# Patient Record
Sex: Female | Born: 1975 | Race: Black or African American | Hispanic: No | Smoking: Never smoker
Health system: Southern US, Community
[De-identification: ages and names within clinical notes are randomized; demographics above are authoritative.]

## PROBLEM LIST (undated history)

## (undated) ENCOUNTER — Inpatient Hospital Stay (HOSPITAL_COMMUNITY): Payer: Self-pay

## (undated) DIAGNOSIS — A599 Trichomoniasis, unspecified: Secondary | ICD-10-CM

## (undated) DIAGNOSIS — F410 Panic disorder [episodic paroxysmal anxiety] without agoraphobia: Secondary | ICD-10-CM

## (undated) DIAGNOSIS — I10 Essential (primary) hypertension: Secondary | ICD-10-CM

## (undated) DIAGNOSIS — O020 Blighted ovum and nonhydatidiform mole: Secondary | ICD-10-CM

## (undated) HISTORY — PX: DILATION AND CURETTAGE OF UTERUS: SHX78

---

## 1998-05-05 ENCOUNTER — Emergency Department (HOSPITAL_COMMUNITY): Admission: EM | Admit: 1998-05-05 | Discharge: 1998-05-05 | Payer: Self-pay | Admitting: Emergency Medicine

## 1998-06-27 ENCOUNTER — Emergency Department (HOSPITAL_COMMUNITY): Admission: EM | Admit: 1998-06-27 | Discharge: 1998-06-27 | Payer: Self-pay | Admitting: Emergency Medicine

## 1998-08-21 ENCOUNTER — Emergency Department (HOSPITAL_COMMUNITY): Admission: EM | Admit: 1998-08-21 | Discharge: 1998-08-21 | Payer: Self-pay | Admitting: Emergency Medicine

## 1998-09-28 ENCOUNTER — Emergency Department (HOSPITAL_COMMUNITY): Admission: EM | Admit: 1998-09-28 | Discharge: 1998-09-28 | Payer: Self-pay | Admitting: Emergency Medicine

## 1998-09-28 ENCOUNTER — Encounter: Payer: Self-pay | Admitting: Emergency Medicine

## 1999-02-18 ENCOUNTER — Emergency Department (HOSPITAL_COMMUNITY): Admission: EM | Admit: 1999-02-18 | Discharge: 1999-02-18 | Payer: Self-pay | Admitting: Emergency Medicine

## 1999-02-25 ENCOUNTER — Encounter: Payer: Self-pay | Admitting: Emergency Medicine

## 1999-02-25 ENCOUNTER — Emergency Department (HOSPITAL_COMMUNITY): Admission: EM | Admit: 1999-02-25 | Discharge: 1999-02-25 | Payer: Self-pay | Admitting: Emergency Medicine

## 1999-05-08 ENCOUNTER — Emergency Department (HOSPITAL_COMMUNITY): Admission: EM | Admit: 1999-05-08 | Discharge: 1999-05-08 | Payer: Self-pay | Admitting: Emergency Medicine

## 1999-09-06 ENCOUNTER — Inpatient Hospital Stay (HOSPITAL_COMMUNITY): Admission: AD | Admit: 1999-09-06 | Discharge: 1999-09-06 | Payer: Self-pay | Admitting: *Deleted

## 1999-09-06 ENCOUNTER — Inpatient Hospital Stay (HOSPITAL_COMMUNITY): Admission: AD | Admit: 1999-09-06 | Discharge: 1999-09-06 | Payer: Self-pay | Admitting: Obstetrics & Gynecology

## 1999-09-09 ENCOUNTER — Encounter (INDEPENDENT_AMBULATORY_CARE_PROVIDER_SITE_OTHER): Payer: Self-pay

## 1999-09-09 ENCOUNTER — Inpatient Hospital Stay (HOSPITAL_COMMUNITY): Admission: AD | Admit: 1999-09-09 | Discharge: 1999-09-11 | Payer: Self-pay | Admitting: Obstetrics & Gynecology

## 1999-09-25 ENCOUNTER — Encounter: Admission: RE | Admit: 1999-09-25 | Discharge: 1999-09-25 | Payer: Self-pay | Admitting: Obstetrics & Gynecology

## 1999-09-25 ENCOUNTER — Other Ambulatory Visit: Admission: RE | Admit: 1999-09-25 | Discharge: 1999-09-25 | Payer: Self-pay | Admitting: Obstetrics & Gynecology

## 1999-10-13 ENCOUNTER — Emergency Department (HOSPITAL_COMMUNITY): Admission: EM | Admit: 1999-10-13 | Discharge: 1999-10-13 | Payer: Self-pay | Admitting: *Deleted

## 1999-10-16 ENCOUNTER — Emergency Department (HOSPITAL_COMMUNITY): Admission: EM | Admit: 1999-10-16 | Discharge: 1999-10-16 | Payer: Self-pay | Admitting: Emergency Medicine

## 1999-10-20 ENCOUNTER — Emergency Department (HOSPITAL_COMMUNITY): Admission: EM | Admit: 1999-10-20 | Discharge: 1999-10-20 | Payer: Self-pay | Admitting: Emergency Medicine

## 1999-10-30 ENCOUNTER — Emergency Department (HOSPITAL_COMMUNITY): Admission: EM | Admit: 1999-10-30 | Discharge: 1999-10-30 | Payer: Self-pay | Admitting: Emergency Medicine

## 1999-11-03 ENCOUNTER — Encounter: Payer: Self-pay | Admitting: Emergency Medicine

## 1999-11-03 ENCOUNTER — Emergency Department (HOSPITAL_COMMUNITY): Admission: EM | Admit: 1999-11-03 | Discharge: 1999-11-03 | Payer: Self-pay | Admitting: Emergency Medicine

## 1999-11-10 ENCOUNTER — Encounter: Admission: RE | Admit: 1999-11-10 | Discharge: 1999-11-10 | Payer: Self-pay | Admitting: Obstetrics & Gynecology

## 1999-11-13 ENCOUNTER — Encounter: Admission: RE | Admit: 1999-11-13 | Discharge: 1999-11-13 | Payer: Self-pay | Admitting: Hematology and Oncology

## 1999-11-17 ENCOUNTER — Emergency Department (HOSPITAL_COMMUNITY): Admission: EM | Admit: 1999-11-17 | Discharge: 1999-11-17 | Payer: Self-pay | Admitting: Emergency Medicine

## 1999-11-27 ENCOUNTER — Encounter: Admission: RE | Admit: 1999-11-27 | Discharge: 1999-11-27 | Payer: Self-pay | Admitting: Obstetrics & Gynecology

## 1999-12-01 ENCOUNTER — Encounter: Admission: RE | Admit: 1999-12-01 | Discharge: 1999-12-01 | Payer: Self-pay | Admitting: Hematology and Oncology

## 1999-12-03 ENCOUNTER — Emergency Department (HOSPITAL_COMMUNITY): Admission: EM | Admit: 1999-12-03 | Discharge: 1999-12-03 | Payer: Self-pay | Admitting: Emergency Medicine

## 1999-12-09 ENCOUNTER — Encounter: Admission: RE | Admit: 1999-12-09 | Discharge: 1999-12-09 | Payer: Self-pay | Admitting: Internal Medicine

## 1999-12-18 ENCOUNTER — Emergency Department (HOSPITAL_COMMUNITY): Admission: EM | Admit: 1999-12-18 | Discharge: 1999-12-18 | Payer: Self-pay | Admitting: Emergency Medicine

## 1999-12-18 ENCOUNTER — Encounter: Payer: Self-pay | Admitting: Emergency Medicine

## 1999-12-19 ENCOUNTER — Emergency Department (HOSPITAL_COMMUNITY): Admission: EM | Admit: 1999-12-19 | Discharge: 1999-12-19 | Payer: Self-pay | Admitting: Emergency Medicine

## 2000-01-21 ENCOUNTER — Emergency Department (HOSPITAL_COMMUNITY): Admission: EM | Admit: 2000-01-21 | Discharge: 2000-01-21 | Payer: Self-pay | Admitting: Emergency Medicine

## 2000-02-03 ENCOUNTER — Emergency Department (HOSPITAL_COMMUNITY): Admission: EM | Admit: 2000-02-03 | Discharge: 2000-02-03 | Payer: Self-pay | Admitting: Emergency Medicine

## 2000-03-31 ENCOUNTER — Encounter: Admission: RE | Admit: 2000-03-31 | Discharge: 2000-03-31 | Payer: Self-pay | Admitting: Hematology and Oncology

## 2000-04-04 ENCOUNTER — Encounter: Admission: RE | Admit: 2000-04-04 | Discharge: 2000-04-04 | Payer: Self-pay | Admitting: Internal Medicine

## 2000-04-11 ENCOUNTER — Encounter: Admission: RE | Admit: 2000-04-11 | Discharge: 2000-04-11 | Payer: Self-pay | Admitting: Internal Medicine

## 2000-04-15 ENCOUNTER — Inpatient Hospital Stay (HOSPITAL_COMMUNITY): Admission: EM | Admit: 2000-04-15 | Discharge: 2000-04-15 | Payer: Self-pay | Admitting: *Deleted

## 2000-04-25 ENCOUNTER — Encounter: Admission: RE | Admit: 2000-04-25 | Discharge: 2000-04-25 | Payer: Self-pay | Admitting: Internal Medicine

## 2000-05-02 ENCOUNTER — Encounter: Admission: RE | Admit: 2000-05-02 | Discharge: 2000-05-02 | Payer: Self-pay | Admitting: Internal Medicine

## 2000-05-06 ENCOUNTER — Encounter: Admission: RE | Admit: 2000-05-06 | Discharge: 2000-05-06 | Payer: Self-pay | Admitting: Internal Medicine

## 2000-05-06 ENCOUNTER — Ambulatory Visit (HOSPITAL_COMMUNITY): Admission: RE | Admit: 2000-05-06 | Discharge: 2000-05-06 | Payer: Self-pay | Admitting: Internal Medicine

## 2000-05-18 ENCOUNTER — Emergency Department (HOSPITAL_COMMUNITY): Admission: EM | Admit: 2000-05-18 | Discharge: 2000-05-18 | Payer: Self-pay | Admitting: Emergency Medicine

## 2000-05-23 ENCOUNTER — Encounter: Admission: RE | Admit: 2000-05-23 | Discharge: 2000-05-23 | Payer: Self-pay | Admitting: Internal Medicine

## 2000-06-22 ENCOUNTER — Inpatient Hospital Stay (HOSPITAL_COMMUNITY): Admission: AD | Admit: 2000-06-22 | Discharge: 2000-06-22 | Payer: Self-pay | Admitting: Obstetrics

## 2000-06-23 ENCOUNTER — Encounter: Payer: Self-pay | Admitting: *Deleted

## 2000-06-23 ENCOUNTER — Inpatient Hospital Stay (HOSPITAL_COMMUNITY): Admission: AD | Admit: 2000-06-23 | Discharge: 2000-06-23 | Payer: Self-pay | Admitting: Obstetrics & Gynecology

## 2000-06-30 ENCOUNTER — Ambulatory Visit (HOSPITAL_COMMUNITY): Admission: RE | Admit: 2000-06-30 | Discharge: 2000-06-30 | Payer: Self-pay | Admitting: *Deleted

## 2000-07-18 ENCOUNTER — Encounter: Admission: RE | Admit: 2000-07-18 | Discharge: 2000-07-18 | Payer: Self-pay | Admitting: Internal Medicine

## 2000-09-17 ENCOUNTER — Inpatient Hospital Stay (HOSPITAL_COMMUNITY): Admission: AD | Admit: 2000-09-17 | Discharge: 2000-09-17 | Payer: Self-pay | Admitting: *Deleted

## 2000-10-21 ENCOUNTER — Inpatient Hospital Stay (HOSPITAL_COMMUNITY): Admission: AD | Admit: 2000-10-21 | Discharge: 2000-10-21 | Payer: Self-pay | Admitting: *Deleted

## 2000-11-04 ENCOUNTER — Ambulatory Visit (HOSPITAL_COMMUNITY): Admission: RE | Admit: 2000-11-04 | Discharge: 2000-11-04 | Payer: Self-pay | Admitting: *Deleted

## 2001-01-01 ENCOUNTER — Emergency Department (HOSPITAL_COMMUNITY): Admission: EM | Admit: 2001-01-01 | Discharge: 2001-01-01 | Payer: Self-pay | Admitting: Emergency Medicine

## 2001-01-26 ENCOUNTER — Inpatient Hospital Stay (HOSPITAL_COMMUNITY): Admission: AD | Admit: 2001-01-26 | Discharge: 2001-01-26 | Payer: Self-pay | Admitting: *Deleted

## 2001-02-18 ENCOUNTER — Inpatient Hospital Stay (HOSPITAL_COMMUNITY): Admission: AD | Admit: 2001-02-18 | Discharge: 2001-02-19 | Payer: Self-pay | Admitting: Obstetrics

## 2001-03-31 ENCOUNTER — Other Ambulatory Visit: Admission: RE | Admit: 2001-03-31 | Discharge: 2001-03-31 | Payer: Self-pay | Admitting: Obstetrics & Gynecology

## 2001-03-31 ENCOUNTER — Encounter: Admission: RE | Admit: 2001-03-31 | Discharge: 2001-03-31 | Payer: Self-pay | Admitting: Obstetrics & Gynecology

## 2001-06-26 ENCOUNTER — Emergency Department (HOSPITAL_COMMUNITY): Admission: EM | Admit: 2001-06-26 | Discharge: 2001-06-26 | Payer: Self-pay | Admitting: Emergency Medicine

## 2001-07-28 ENCOUNTER — Encounter: Admission: RE | Admit: 2001-07-28 | Discharge: 2001-07-28 | Payer: Self-pay | Admitting: Internal Medicine

## 2001-08-28 ENCOUNTER — Emergency Department (HOSPITAL_COMMUNITY): Admission: EM | Admit: 2001-08-28 | Discharge: 2001-08-28 | Payer: Self-pay | Admitting: Emergency Medicine

## 2002-01-09 ENCOUNTER — Emergency Department (HOSPITAL_COMMUNITY): Admission: EM | Admit: 2002-01-09 | Discharge: 2002-01-09 | Payer: Self-pay | Admitting: Emergency Medicine

## 2002-04-27 ENCOUNTER — Emergency Department (HOSPITAL_COMMUNITY): Admission: EM | Admit: 2002-04-27 | Discharge: 2002-04-27 | Payer: Self-pay | Admitting: Emergency Medicine

## 2002-05-24 ENCOUNTER — Emergency Department (HOSPITAL_COMMUNITY): Admission: EM | Admit: 2002-05-24 | Discharge: 2002-05-24 | Payer: Self-pay | Admitting: Emergency Medicine

## 2002-08-21 ENCOUNTER — Emergency Department (HOSPITAL_COMMUNITY): Admission: EM | Admit: 2002-08-21 | Discharge: 2002-08-21 | Payer: Self-pay | Admitting: Emergency Medicine

## 2003-04-04 ENCOUNTER — Emergency Department (HOSPITAL_COMMUNITY): Admission: EM | Admit: 2003-04-04 | Discharge: 2003-04-04 | Payer: Self-pay | Admitting: Emergency Medicine

## 2003-12-10 ENCOUNTER — Emergency Department (HOSPITAL_COMMUNITY): Admission: EM | Admit: 2003-12-10 | Discharge: 2003-12-10 | Payer: Self-pay | Admitting: Emergency Medicine

## 2003-12-25 ENCOUNTER — Encounter: Payer: Self-pay | Admitting: *Deleted

## 2003-12-25 ENCOUNTER — Encounter (INDEPENDENT_AMBULATORY_CARE_PROVIDER_SITE_OTHER): Payer: Self-pay | Admitting: *Deleted

## 2003-12-25 ENCOUNTER — Inpatient Hospital Stay (HOSPITAL_COMMUNITY): Admission: AD | Admit: 2003-12-25 | Discharge: 2003-12-26 | Payer: Self-pay | Admitting: *Deleted

## 2004-02-06 ENCOUNTER — Emergency Department (HOSPITAL_COMMUNITY): Admission: EM | Admit: 2004-02-06 | Discharge: 2004-02-06 | Payer: Self-pay | Admitting: Emergency Medicine

## 2004-07-06 ENCOUNTER — Emergency Department (HOSPITAL_COMMUNITY): Admission: EM | Admit: 2004-07-06 | Discharge: 2004-07-06 | Payer: Self-pay | Admitting: Emergency Medicine

## 2004-10-30 ENCOUNTER — Emergency Department (HOSPITAL_COMMUNITY): Admission: EM | Admit: 2004-10-30 | Discharge: 2004-10-30 | Payer: Self-pay | Admitting: *Deleted

## 2005-01-05 ENCOUNTER — Encounter: Admission: RE | Admit: 2005-01-05 | Discharge: 2005-01-05 | Payer: Self-pay | Admitting: *Deleted

## 2005-07-05 ENCOUNTER — Ambulatory Visit (HOSPITAL_COMMUNITY): Admission: RE | Admit: 2005-07-05 | Discharge: 2005-07-05 | Payer: Self-pay | Admitting: Family Medicine

## 2005-09-25 ENCOUNTER — Emergency Department (HOSPITAL_COMMUNITY): Admission: EM | Admit: 2005-09-25 | Discharge: 2005-09-25 | Payer: Self-pay | Admitting: Emergency Medicine

## 2005-11-05 ENCOUNTER — Emergency Department (HOSPITAL_COMMUNITY): Admission: EM | Admit: 2005-11-05 | Discharge: 2005-11-05 | Payer: Self-pay | Admitting: Emergency Medicine

## 2005-12-07 ENCOUNTER — Emergency Department (HOSPITAL_COMMUNITY): Admission: EM | Admit: 2005-12-07 | Discharge: 2005-12-07 | Payer: Self-pay | Admitting: Emergency Medicine

## 2006-04-21 ENCOUNTER — Emergency Department (HOSPITAL_COMMUNITY): Admission: EM | Admit: 2006-04-21 | Discharge: 2006-04-21 | Payer: Self-pay | Admitting: Emergency Medicine

## 2006-04-25 ENCOUNTER — Inpatient Hospital Stay (HOSPITAL_COMMUNITY): Admission: AD | Admit: 2006-04-25 | Discharge: 2006-04-25 | Payer: Self-pay | Admitting: Gynecology

## 2006-04-25 ENCOUNTER — Ambulatory Visit: Payer: Self-pay | Admitting: Obstetrics & Gynecology

## 2006-04-26 ENCOUNTER — Encounter (INDEPENDENT_AMBULATORY_CARE_PROVIDER_SITE_OTHER): Payer: Self-pay | Admitting: *Deleted

## 2006-04-26 ENCOUNTER — Ambulatory Visit (HOSPITAL_COMMUNITY): Admission: RE | Admit: 2006-04-26 | Discharge: 2006-04-26 | Payer: Self-pay | Admitting: Obstetrics & Gynecology

## 2006-04-26 ENCOUNTER — Ambulatory Visit: Payer: Self-pay | Admitting: Obstetrics & Gynecology

## 2006-05-04 ENCOUNTER — Ambulatory Visit: Payer: Self-pay | Admitting: Obstetrics & Gynecology

## 2006-05-11 ENCOUNTER — Ambulatory Visit: Payer: Self-pay | Admitting: Obstetrics and Gynecology

## 2006-05-18 ENCOUNTER — Ambulatory Visit: Payer: Self-pay | Admitting: Gynecology

## 2006-05-25 ENCOUNTER — Ambulatory Visit: Payer: Self-pay | Admitting: Gynecology

## 2006-05-31 ENCOUNTER — Ambulatory Visit: Admission: RE | Admit: 2006-05-31 | Discharge: 2006-05-31 | Payer: Self-pay | Admitting: Gynecologic Oncology

## 2006-06-09 ENCOUNTER — Ambulatory Visit (HOSPITAL_COMMUNITY): Admission: RE | Admit: 2006-06-09 | Discharge: 2006-06-09 | Payer: Self-pay | Admitting: Gynecologic Oncology

## 2006-06-16 ENCOUNTER — Ambulatory Visit: Payer: Self-pay | Admitting: Obstetrics and Gynecology

## 2006-06-24 ENCOUNTER — Ambulatory Visit: Payer: Self-pay | Admitting: Obstetrics & Gynecology

## 2006-07-06 ENCOUNTER — Ambulatory Visit: Payer: Self-pay | Admitting: Gynecology

## 2006-08-10 ENCOUNTER — Ambulatory Visit: Payer: Self-pay | Admitting: Gynecology

## 2006-09-17 ENCOUNTER — Emergency Department (HOSPITAL_COMMUNITY): Admission: EM | Admit: 2006-09-17 | Discharge: 2006-09-17 | Payer: Self-pay | Admitting: Emergency Medicine

## 2006-10-12 ENCOUNTER — Ambulatory Visit: Payer: Self-pay | Admitting: Obstetrics & Gynecology

## 2006-10-29 ENCOUNTER — Emergency Department (HOSPITAL_COMMUNITY): Admission: EM | Admit: 2006-10-29 | Discharge: 2006-10-29 | Payer: Self-pay | Admitting: Emergency Medicine

## 2007-03-03 ENCOUNTER — Inpatient Hospital Stay (HOSPITAL_COMMUNITY): Admission: AD | Admit: 2007-03-03 | Discharge: 2007-03-03 | Payer: Self-pay | Admitting: Gynecology

## 2007-03-04 ENCOUNTER — Inpatient Hospital Stay (HOSPITAL_COMMUNITY): Admission: AD | Admit: 2007-03-04 | Discharge: 2007-03-04 | Payer: Self-pay | Admitting: Obstetrics & Gynecology

## 2007-03-25 ENCOUNTER — Inpatient Hospital Stay (HOSPITAL_COMMUNITY): Admission: AD | Admit: 2007-03-25 | Discharge: 2007-03-25 | Payer: Self-pay | Admitting: Obstetrics and Gynecology

## 2007-05-18 ENCOUNTER — Ambulatory Visit (HOSPITAL_COMMUNITY): Admission: RE | Admit: 2007-05-18 | Discharge: 2007-05-18 | Payer: Self-pay | Admitting: Obstetrics & Gynecology

## 2007-05-29 ENCOUNTER — Ambulatory Visit: Payer: Self-pay | Admitting: Obstetrics & Gynecology

## 2007-05-31 ENCOUNTER — Ambulatory Visit (HOSPITAL_COMMUNITY): Admission: RE | Admit: 2007-05-31 | Discharge: 2007-05-31 | Payer: Self-pay | Admitting: Obstetrics & Gynecology

## 2007-05-31 ENCOUNTER — Ambulatory Visit: Payer: Self-pay | Admitting: Gynecology

## 2007-05-31 ENCOUNTER — Encounter: Payer: Self-pay | Admitting: *Deleted

## 2007-06-02 ENCOUNTER — Ambulatory Visit (HOSPITAL_COMMUNITY): Admission: RE | Admit: 2007-06-02 | Discharge: 2007-06-02 | Payer: Self-pay | Admitting: Obstetrics & Gynecology

## 2007-06-12 ENCOUNTER — Ambulatory Visit: Payer: Self-pay | Admitting: Obstetrics & Gynecology

## 2007-07-05 ENCOUNTER — Ambulatory Visit (HOSPITAL_COMMUNITY): Admission: RE | Admit: 2007-07-05 | Discharge: 2007-07-05 | Payer: Self-pay | Admitting: Obstetrics & Gynecology

## 2007-07-10 ENCOUNTER — Ambulatory Visit: Payer: Self-pay | Admitting: Obstetrics & Gynecology

## 2007-07-17 ENCOUNTER — Ambulatory Visit: Payer: Self-pay | Admitting: Obstetrics & Gynecology

## 2007-07-26 ENCOUNTER — Ambulatory Visit (HOSPITAL_COMMUNITY): Admission: RE | Admit: 2007-07-26 | Discharge: 2007-07-26 | Payer: Self-pay | Admitting: Obstetrics & Gynecology

## 2007-07-31 ENCOUNTER — Ambulatory Visit: Payer: Self-pay | Admitting: Obstetrics & Gynecology

## 2007-08-07 ENCOUNTER — Ambulatory Visit: Payer: Self-pay | Admitting: Obstetrics & Gynecology

## 2007-08-16 ENCOUNTER — Ambulatory Visit (HOSPITAL_COMMUNITY): Admission: RE | Admit: 2007-08-16 | Discharge: 2007-08-16 | Payer: Self-pay | Admitting: Obstetrics & Gynecology

## 2007-08-17 ENCOUNTER — Ambulatory Visit: Payer: Self-pay | Admitting: Obstetrics & Gynecology

## 2007-08-21 ENCOUNTER — Ambulatory Visit: Payer: Self-pay | Admitting: Obstetrics & Gynecology

## 2007-08-24 ENCOUNTER — Ambulatory Visit: Payer: Self-pay | Admitting: Obstetrics & Gynecology

## 2007-08-28 ENCOUNTER — Ambulatory Visit: Payer: Self-pay | Admitting: Obstetrics & Gynecology

## 2007-08-31 ENCOUNTER — Ambulatory Visit: Payer: Self-pay | Admitting: Obstetrics & Gynecology

## 2007-08-31 ENCOUNTER — Ambulatory Visit: Payer: Self-pay | Admitting: *Deleted

## 2007-08-31 ENCOUNTER — Observation Stay (HOSPITAL_COMMUNITY): Admission: AD | Admit: 2007-08-31 | Discharge: 2007-09-01 | Payer: Self-pay | Admitting: Obstetrics & Gynecology

## 2007-08-31 ENCOUNTER — Encounter: Payer: Self-pay | Admitting: *Deleted

## 2007-09-02 ENCOUNTER — Inpatient Hospital Stay (HOSPITAL_COMMUNITY): Admission: AD | Admit: 2007-09-02 | Discharge: 2007-09-02 | Payer: Self-pay | Admitting: Obstetrics and Gynecology

## 2007-09-02 ENCOUNTER — Ambulatory Visit: Payer: Self-pay | Admitting: Physician Assistant

## 2007-09-04 ENCOUNTER — Ambulatory Visit: Payer: Self-pay | Admitting: *Deleted

## 2007-09-06 ENCOUNTER — Ambulatory Visit: Payer: Self-pay | Admitting: Gynecology

## 2007-09-06 ENCOUNTER — Ambulatory Visit (HOSPITAL_COMMUNITY): Admission: RE | Admit: 2007-09-06 | Discharge: 2007-09-06 | Payer: Self-pay | Admitting: Obstetrics & Gynecology

## 2007-09-11 ENCOUNTER — Ambulatory Visit: Payer: Self-pay | Admitting: *Deleted

## 2007-09-11 ENCOUNTER — Ambulatory Visit (HOSPITAL_COMMUNITY): Admission: RE | Admit: 2007-09-11 | Discharge: 2007-09-11 | Payer: Self-pay | Admitting: Gynecology

## 2007-09-12 ENCOUNTER — Inpatient Hospital Stay (HOSPITAL_COMMUNITY): Admission: AD | Admit: 2007-09-12 | Discharge: 2007-09-15 | Payer: Self-pay | Admitting: Obstetrics and Gynecology

## 2007-09-12 ENCOUNTER — Ambulatory Visit: Payer: Self-pay | Admitting: Advanced Practice Midwife

## 2007-10-11 ENCOUNTER — Ambulatory Visit: Payer: Self-pay | Admitting: Obstetrics and Gynecology

## 2008-03-03 ENCOUNTER — Emergency Department (HOSPITAL_COMMUNITY): Admission: EM | Admit: 2008-03-03 | Discharge: 2008-03-03 | Payer: Self-pay | Admitting: Emergency Medicine

## 2008-07-13 ENCOUNTER — Emergency Department (HOSPITAL_COMMUNITY): Admission: EM | Admit: 2008-07-13 | Discharge: 2008-07-13 | Payer: Self-pay | Admitting: Emergency Medicine

## 2008-07-16 ENCOUNTER — Emergency Department (HOSPITAL_COMMUNITY): Admission: EM | Admit: 2008-07-16 | Discharge: 2008-07-16 | Payer: Self-pay | Admitting: Emergency Medicine

## 2008-11-29 ENCOUNTER — Emergency Department (HOSPITAL_COMMUNITY): Admission: EM | Admit: 2008-11-29 | Discharge: 2008-11-29 | Payer: Self-pay | Admitting: Emergency Medicine

## 2009-02-12 ENCOUNTER — Emergency Department (HOSPITAL_COMMUNITY): Admission: EM | Admit: 2009-02-12 | Discharge: 2009-02-12 | Payer: Self-pay | Admitting: Emergency Medicine

## 2009-06-14 ENCOUNTER — Emergency Department (HOSPITAL_COMMUNITY): Admission: EM | Admit: 2009-06-14 | Discharge: 2009-06-14 | Payer: Self-pay | Admitting: Emergency Medicine

## 2009-07-24 ENCOUNTER — Emergency Department (HOSPITAL_COMMUNITY): Admission: EM | Admit: 2009-07-24 | Discharge: 2009-07-24 | Payer: Self-pay | Admitting: Family Medicine

## 2009-09-27 ENCOUNTER — Emergency Department (HOSPITAL_COMMUNITY): Admission: EM | Admit: 2009-09-27 | Discharge: 2009-09-28 | Payer: Self-pay | Admitting: Emergency Medicine

## 2009-10-28 ENCOUNTER — Inpatient Hospital Stay (HOSPITAL_COMMUNITY): Admission: AD | Admit: 2009-10-28 | Discharge: 2009-10-28 | Payer: Self-pay | Admitting: Obstetrics & Gynecology

## 2010-01-18 ENCOUNTER — Inpatient Hospital Stay (HOSPITAL_COMMUNITY): Admission: AD | Admit: 2010-01-18 | Discharge: 2010-01-18 | Payer: Self-pay | Admitting: Obstetrics & Gynecology

## 2010-01-18 ENCOUNTER — Ambulatory Visit: Payer: Self-pay | Admitting: Obstetrics and Gynecology

## 2010-02-05 IMAGING — CR DG ABDOMEN 2V
2 series · 2 of 2 positions shown · non-contrast
Comparison: None

CLINICAL DATA: Abdominal pain.

ABDOMEN - 2 VIEW

[view not recorded (1 of 2)]
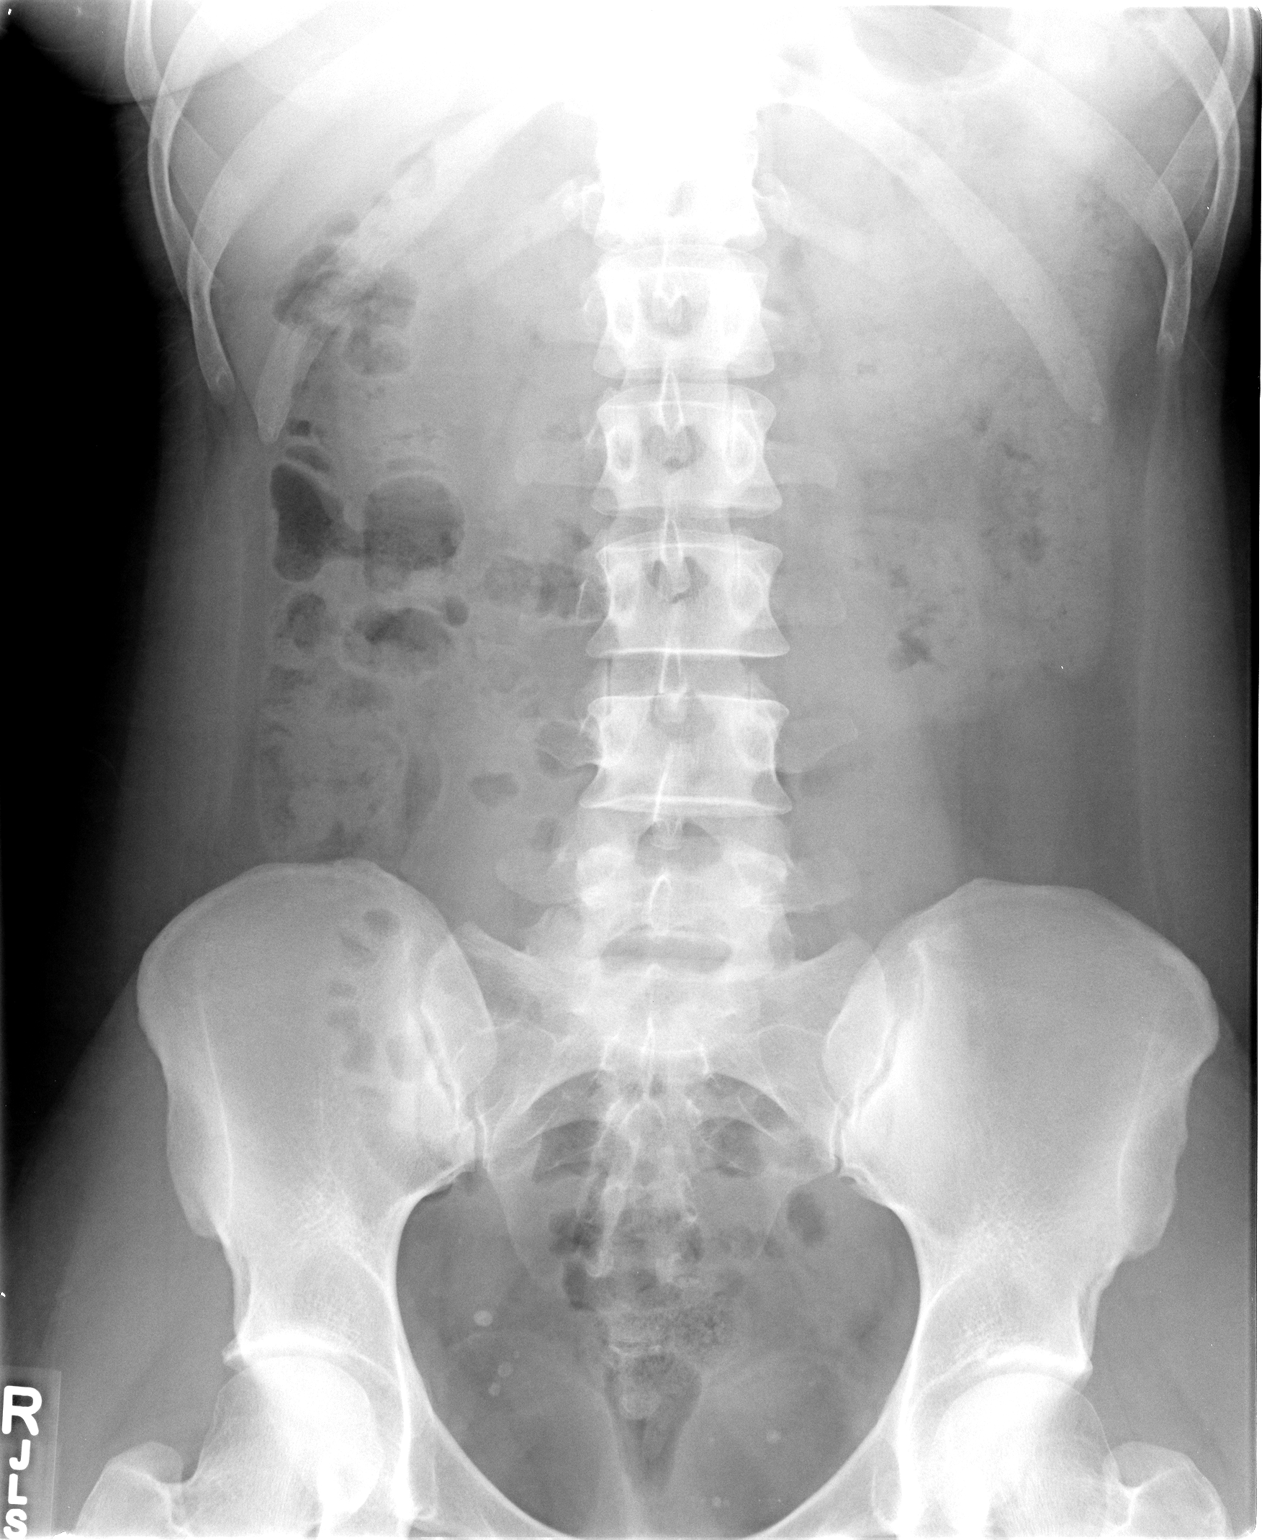

[view not recorded (2 of 2)]
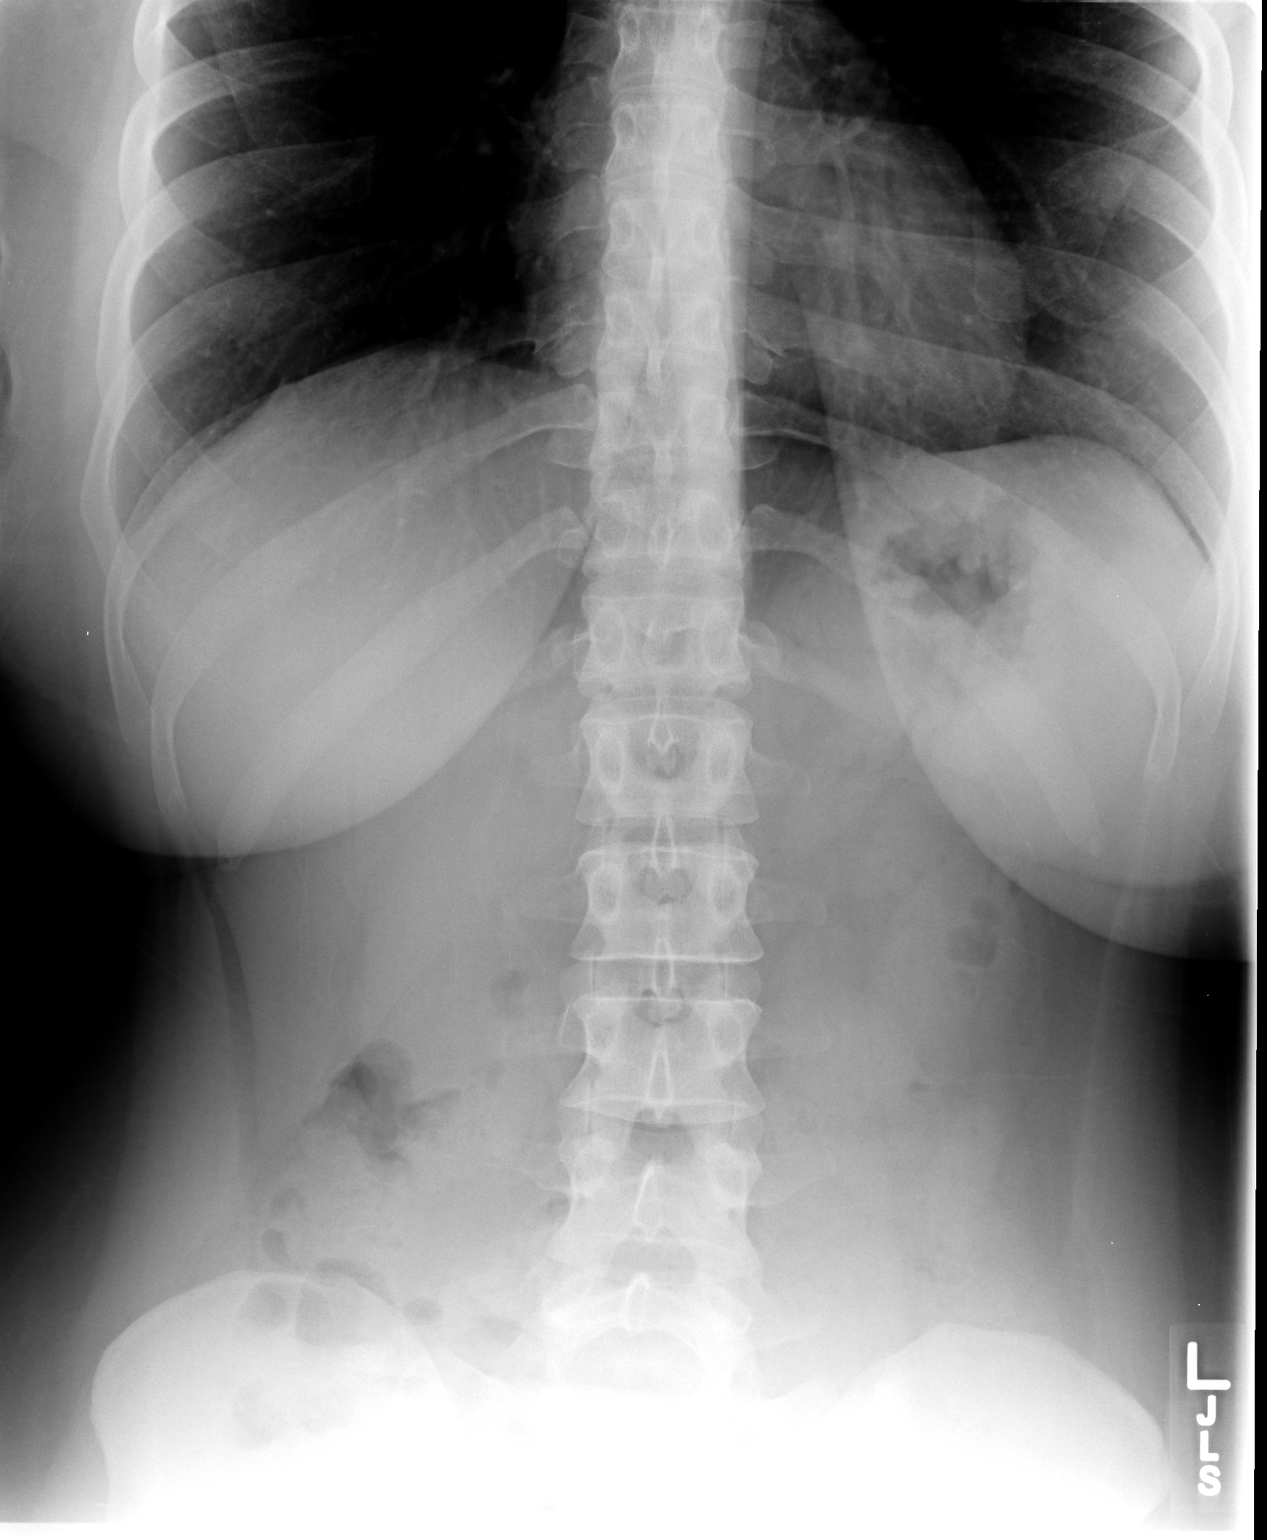

[2 of 2 positions shown; findings below may reference images not displayed]

FINDINGS: Bowel gas pattern is unremarkable.  Properitoneal fat
stripes and psoas muscle margins are defined.  No unusual
calcification.  No definite mass effect.  Bony structures
unremarkable.
IMPRESSION: No acute abdominal findings.

## 2010-03-05 ENCOUNTER — Ambulatory Visit (HOSPITAL_COMMUNITY): Admission: RE | Admit: 2010-03-05 | Discharge: 2010-03-05 | Payer: Self-pay | Admitting: Obstetrics & Gynecology

## 2010-03-26 ENCOUNTER — Encounter (INDEPENDENT_AMBULATORY_CARE_PROVIDER_SITE_OTHER): Payer: Self-pay | Admitting: *Deleted

## 2010-03-26 ENCOUNTER — Ambulatory Visit: Payer: Self-pay | Admitting: Family Medicine

## 2010-03-26 LAB — CONVERTED CEMR LAB
ALT: 11 units/L (ref 0–35)
Albumin: 3.6 g/dL (ref 3.5–5.2)
CO2: 21 meq/L (ref 19–32)
Glucose, Bld: 79 mg/dL (ref 70–99)
Platelets: 209 10*3/uL (ref 150–400)
Potassium: 3.5 meq/L (ref 3.5–5.3)
RBC: 4.17 M/uL (ref 3.87–5.11)
Sodium: 140 meq/L (ref 135–145)
Total Bilirubin: 0.8 mg/dL (ref 0.3–1.2)
Total Protein: 6.7 g/dL (ref 6.0–8.3)
Uric Acid, Serum: 5.5 mg/dL (ref 2.4–7.0)
WBC: 7.2 10*3/uL (ref 4.0–10.5)

## 2010-04-02 ENCOUNTER — Encounter: Payer: Self-pay | Admitting: Obstetrics and Gynecology

## 2010-04-02 ENCOUNTER — Ambulatory Visit: Payer: Self-pay | Admitting: Family Medicine

## 2010-04-02 LAB — CONVERTED CEMR LAB
ALT: 9 units/L (ref 0–35)
AST: 16 units/L (ref 0–37)
CO2: 20 meq/L (ref 19–32)
Creatinine 24 HR UR: 1071 mg/24hr (ref 700–1800)
Creatinine Clearance: 108 mL/min (ref 75–115)
Creatinine, Ser: 0.69 mg/dL (ref 0.40–1.20)
HCT: 32.9 % — ABNORMAL LOW (ref 36.0–46.0)
MCV: 81.8 fL (ref 78.0–100.0)
Platelets: 187 10*3/uL (ref 150–400)
Protein, Ur: 77 mg/24hr (ref 50–100)
RDW: 13.8 % (ref 11.5–15.5)
Sodium: 139 meq/L (ref 135–145)
Total Bilirubin: 0.6 mg/dL (ref 0.3–1.2)
Total Protein: 6.4 g/dL (ref 6.0–8.3)
WBC: 7 10*3/uL (ref 4.0–10.5)

## 2010-04-09 ENCOUNTER — Ambulatory Visit (HOSPITAL_COMMUNITY): Admission: RE | Admit: 2010-04-09 | Discharge: 2010-04-09 | Payer: Self-pay | Admitting: Anesthesiology

## 2010-04-16 ENCOUNTER — Encounter (INDEPENDENT_AMBULATORY_CARE_PROVIDER_SITE_OTHER): Payer: Self-pay | Admitting: *Deleted

## 2010-04-16 ENCOUNTER — Ambulatory Visit: Payer: Self-pay | Admitting: Obstetrics and Gynecology

## 2010-04-16 LAB — CONVERTED CEMR LAB
Chlamydia, DNA Probe: NEGATIVE
GC Probe Amp, Genital: NEGATIVE

## 2010-04-23 ENCOUNTER — Ambulatory Visit: Payer: Self-pay | Admitting: Obstetrics & Gynecology

## 2010-04-30 ENCOUNTER — Ambulatory Visit (HOSPITAL_COMMUNITY): Admission: RE | Admit: 2010-04-30 | Discharge: 2010-04-30 | Payer: Self-pay | Admitting: Family Medicine

## 2010-04-30 ENCOUNTER — Encounter: Payer: Self-pay | Admitting: Physician Assistant

## 2010-04-30 ENCOUNTER — Ambulatory Visit: Payer: Self-pay | Admitting: Obstetrics and Gynecology

## 2010-05-04 ENCOUNTER — Inpatient Hospital Stay (HOSPITAL_COMMUNITY): Admission: AD | Admit: 2010-05-04 | Discharge: 2010-05-06 | Payer: Self-pay | Admitting: Obstetrics & Gynecology

## 2010-05-04 ENCOUNTER — Ambulatory Visit: Payer: Self-pay | Admitting: Obstetrics and Gynecology

## 2010-05-12 ENCOUNTER — Emergency Department (HOSPITAL_COMMUNITY): Admission: EM | Admit: 2010-05-12 | Discharge: 2010-05-12 | Payer: Self-pay | Admitting: Emergency Medicine

## 2010-10-04 ENCOUNTER — Encounter: Payer: Self-pay | Admitting: *Deleted

## 2010-10-05 ENCOUNTER — Encounter: Payer: Self-pay | Admitting: *Deleted

## 2010-11-12 IMAGING — US US OB LIMITED
1 series · 11 of 11 positions shown · non-contrast
Comparison: none

OBSTETRICAL ULTRASOUND:
 This ultrasound exam was performed in the [HOSPITAL] Ultrasound Department.  The OB US report was generated in the AS system, and faxed to the ordering physician.  This report is also available in [HOSPITAL]?s [employer] and in [REDACTED] PACS.

[Series 1: us fetal bpp w/o nonstress · non-contrast · 0.33mm/px · 11 acquisitions, 11 frames shown]
[im 1/11]
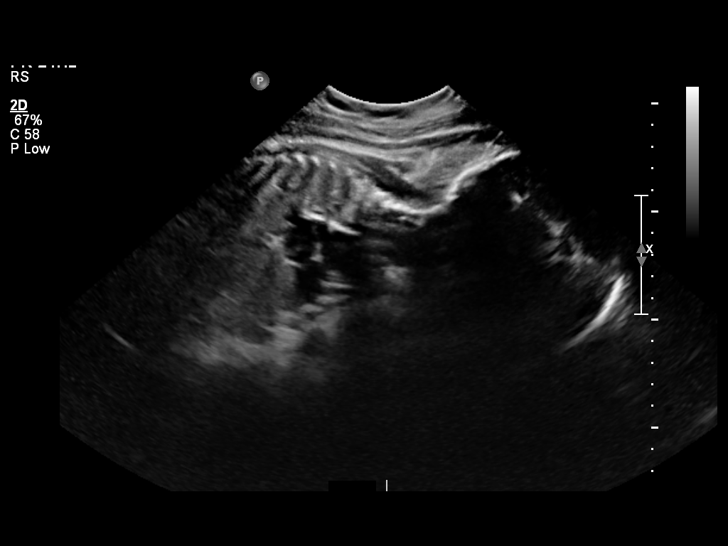
[im 2/11]
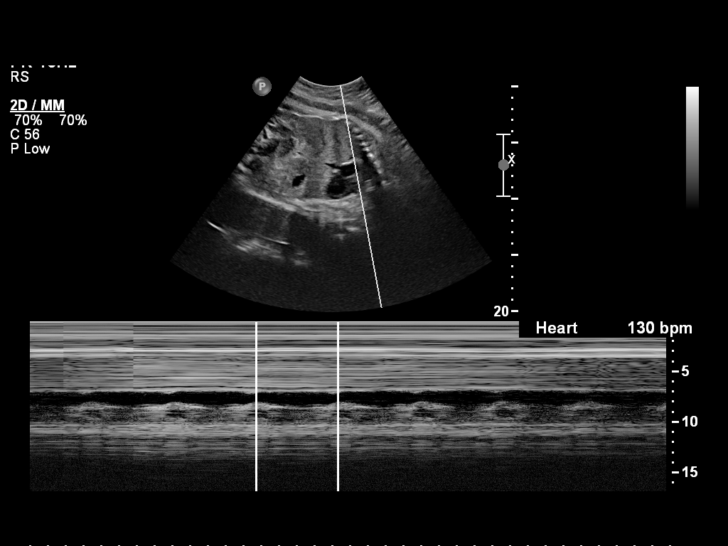
[im 3/11]
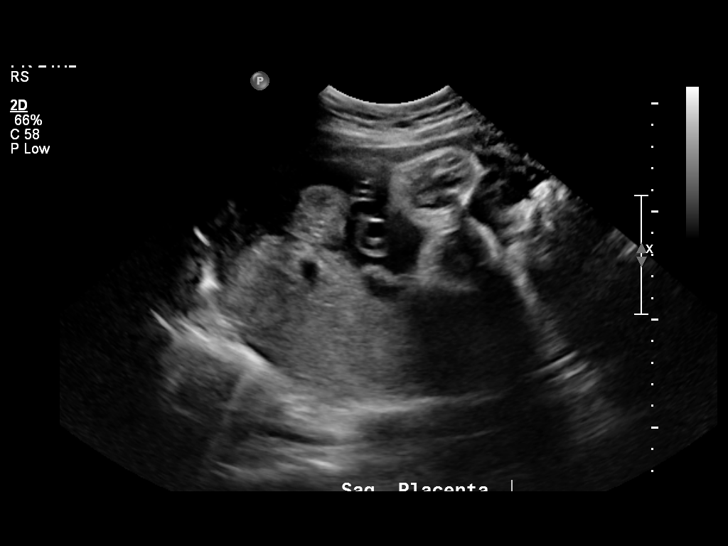
[im 4/11]
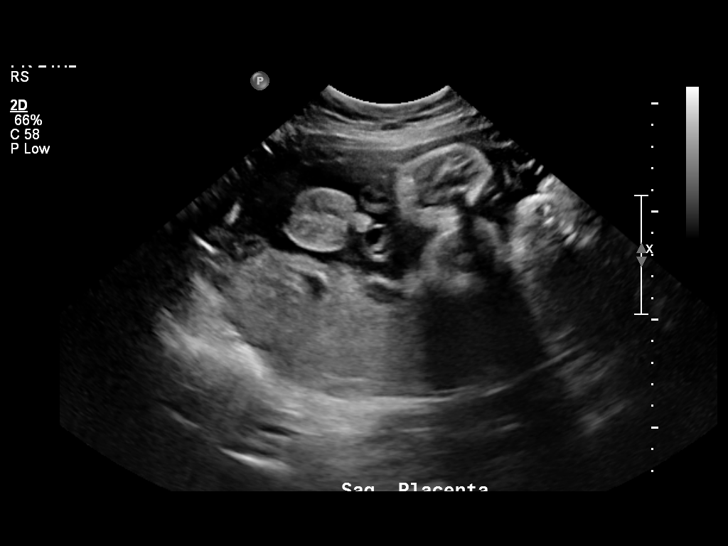
[im 5/11]
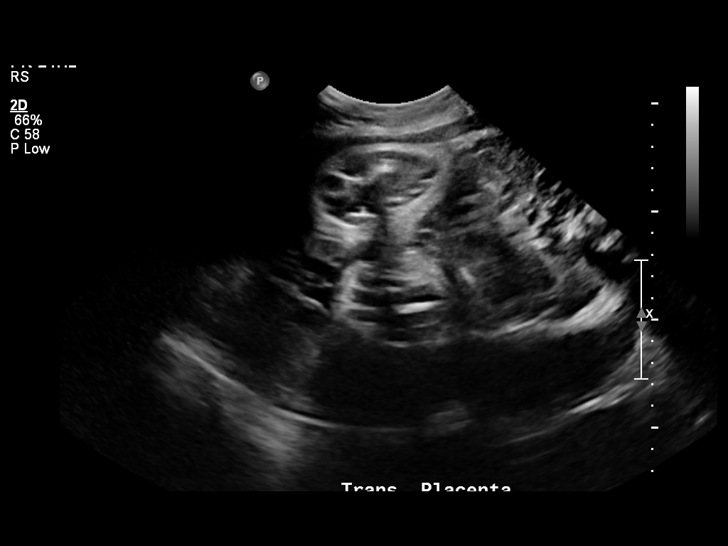
[im 6/11]
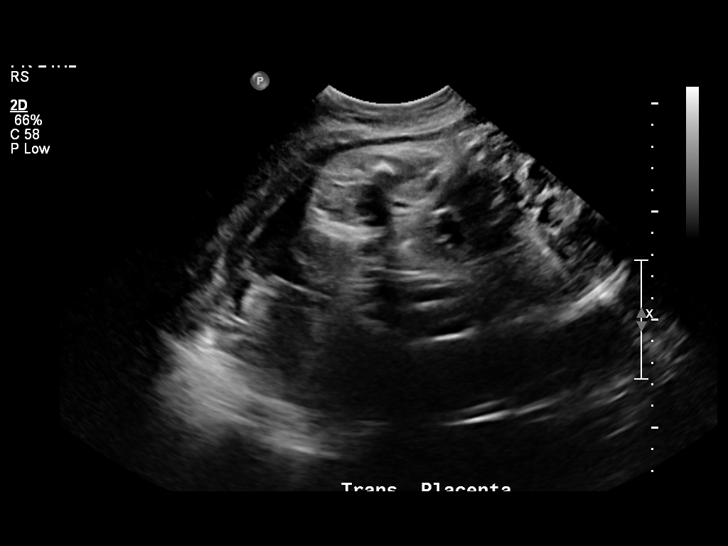
[im 7/11]
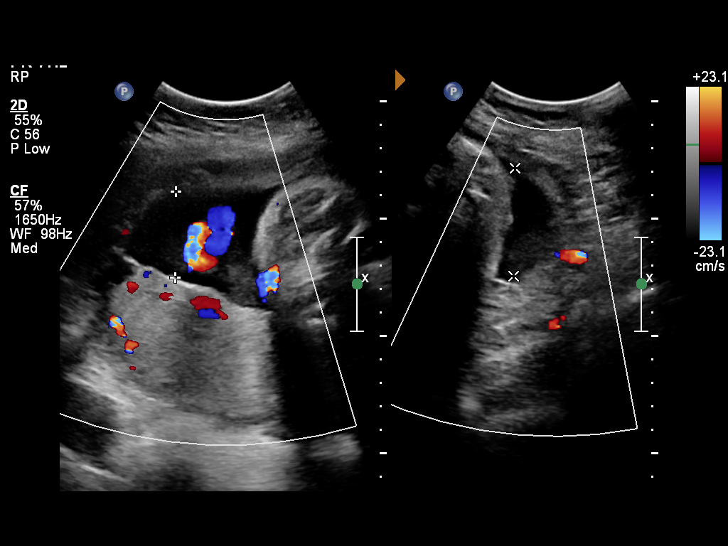
[im 8/11]
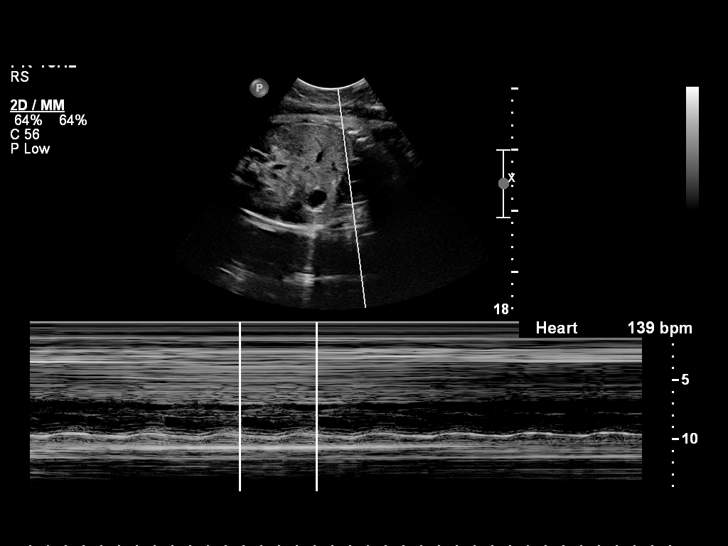
[im 9/11]
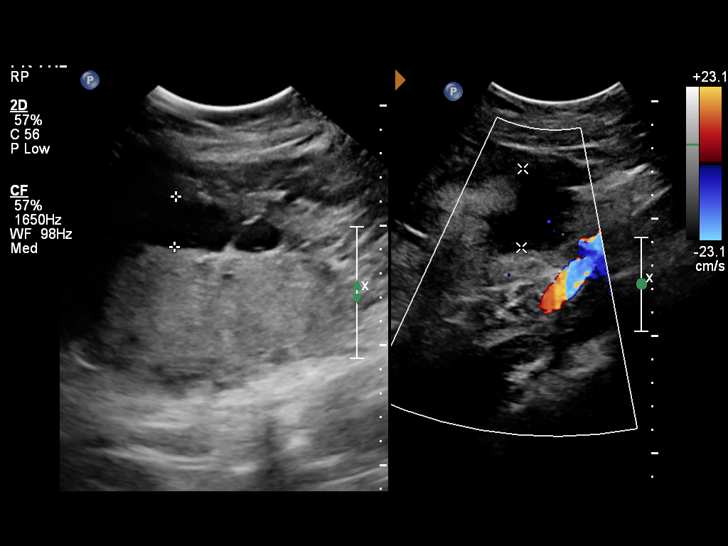
[im 10/11]
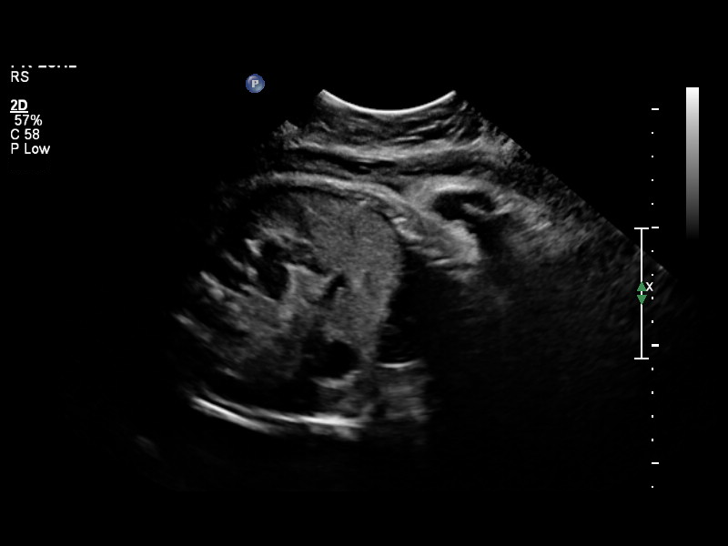
[im 11/11]
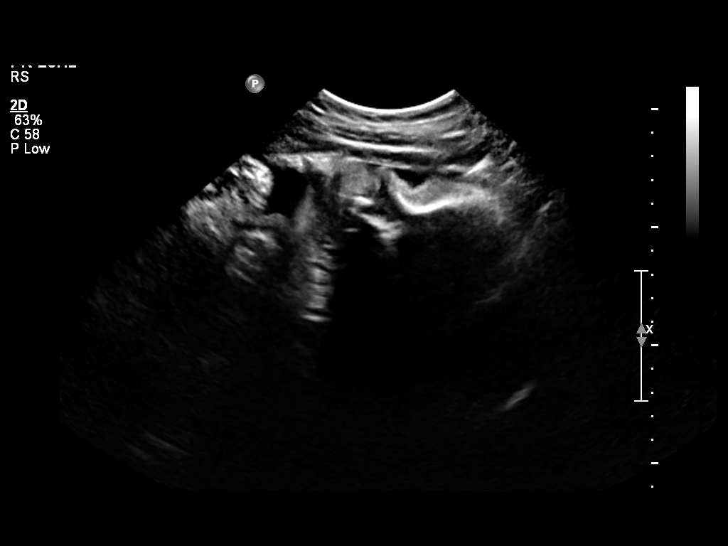

[11 of 11 positions shown; findings below may reference images not displayed]

IMPRESSION: See AS Obstetric US report.

## 2010-11-27 LAB — URINALYSIS, ROUTINE W REFLEX MICROSCOPIC
Glucose, UA: NEGATIVE mg/dL
Ketones, ur: NEGATIVE mg/dL
Nitrite: NEGATIVE
Protein, ur: NEGATIVE mg/dL
Urobilinogen, UA: 0.2 mg/dL (ref 0.0–1.0)

## 2010-11-27 LAB — POCT URINALYSIS DIPSTICK
Bilirubin Urine: NEGATIVE
Glucose, UA: NEGATIVE mg/dL
Hgb urine dipstick: NEGATIVE
Ketones, ur: NEGATIVE mg/dL
Ketones, ur: NEGATIVE mg/dL
Nitrite: NEGATIVE
Protein, ur: 30 mg/dL — AB
Protein, ur: 30 mg/dL — AB
Protein, ur: NEGATIVE mg/dL
Specific Gravity, Urine: 1.02 (ref 1.005–1.030)
Urobilinogen, UA: 2 mg/dL — ABNORMAL HIGH (ref 0.0–1.0)
pH: 6 (ref 5.0–8.0)
pH: 6 (ref 5.0–8.0)

## 2010-11-27 LAB — CBC
HCT: 34.8 % — ABNORMAL LOW (ref 36.0–46.0)
HCT: 35.5 % — ABNORMAL LOW (ref 36.0–46.0)
Hemoglobin: 11.7 g/dL — ABNORMAL LOW (ref 12.0–15.0)
MCH: 29.9 pg (ref 26.0–34.0)
MCHC: 33.6 g/dL (ref 30.0–36.0)
MCV: 88.5 fL (ref 78.0–100.0)
MCV: 88.6 fL (ref 78.0–100.0)
RDW: 13.8 % (ref 11.5–15.5)
WBC: 12.8 10*3/uL — ABNORMAL HIGH (ref 4.0–10.5)

## 2010-11-27 LAB — URINE MICROSCOPIC-ADD ON

## 2010-11-28 LAB — POCT URINALYSIS DIP (DEVICE)
Hgb urine dipstick: NEGATIVE
Protein, ur: 30 mg/dL — AB
Specific Gravity, Urine: 1.02 (ref 1.005–1.030)
Urobilinogen, UA: 1 mg/dL (ref 0.0–1.0)
pH: 6.5 (ref 5.0–8.0)

## 2010-11-29 LAB — WET PREP, GENITAL: Yeast Wet Prep HPF POC: NONE SEEN

## 2010-11-29 LAB — URINALYSIS, ROUTINE W REFLEX MICROSCOPIC
Bilirubin Urine: NEGATIVE
Nitrite: NEGATIVE
Specific Gravity, Urine: 1.006 (ref 1.005–1.030)
pH: 6.5 (ref 5.0–8.0)

## 2010-11-29 LAB — CBC
Hemoglobin: 13.1 g/dL (ref 12.0–15.0)
MCHC: 34.3 g/dL (ref 30.0–36.0)
RDW: 13.6 % (ref 11.5–15.5)

## 2010-11-29 LAB — COMPREHENSIVE METABOLIC PANEL
ALT: 50 U/L — ABNORMAL HIGH (ref 0–35)
Calcium: 8.9 mg/dL (ref 8.4–10.5)
GFR calc Af Amer: >60 mL/min (ref >60)
Glucose, Bld: 105 mg/dL — ABNORMAL HIGH (ref 70–99)
Sodium: 136 mEq/L (ref 135–145)
Total Protein: 7.4 g/dL (ref 6.0–8.3)

## 2010-11-29 LAB — DIFFERENTIAL
Eosinophils Absolute: 0.1 10*3/uL (ref 0.0–0.7)
Lymphs Abs: 2.2 10*3/uL (ref 0.7–4.0)
Monocytes Relative: 5 % (ref 3–12)
Neutrophils Relative %: 77 % (ref 43–77)

## 2010-11-29 LAB — LIPASE, BLOOD: Lipase: 39 U/L (ref 11–59)

## 2010-11-29 LAB — URINE MICROSCOPIC-ADD ON

## 2010-11-29 LAB — POCT URINALYSIS DIP (DEVICE)
Hgb urine dipstick: NEGATIVE
Nitrite: NEGATIVE
Protein, ur: 30 mg/dL — AB
Urobilinogen, UA: 1 mg/dL (ref 0.0–1.0)
pH: 6 (ref 5.0–8.0)

## 2010-11-29 LAB — POCT PREGNANCY, URINE: Preg Test, Ur: POSITIVE

## 2010-12-01 LAB — URINALYSIS, ROUTINE W REFLEX MICROSCOPIC
Glucose, UA: NEGATIVE mg/dL
Protein, ur: NEGATIVE mg/dL
Specific Gravity, Urine: <1.005 — ABNORMAL LOW (ref 1.005–1.030)
pH: 6.5 (ref 5.0–8.0)

## 2010-12-01 LAB — URINE MICROSCOPIC-ADD ON

## 2010-12-01 LAB — STREP B DNA PROBE: Strep Group B Ag: NEGATIVE

## 2010-12-01 LAB — GC/CHLAMYDIA PROBE AMP, GENITAL
Chlamydia, DNA Probe: NEGATIVE
GC Probe Amp, Genital: NEGATIVE

## 2010-12-04 LAB — COMPREHENSIVE METABOLIC PANEL
Alkaline Phosphatase: 58 U/L (ref 39–117)
BUN: 6 mg/dL (ref 6–23)
Chloride: 104 mEq/L (ref 96–112)
Creatinine, Ser: 0.74 mg/dL (ref 0.4–1.2)
Glucose, Bld: 95 mg/dL (ref 70–99)
Potassium: 3.9 mEq/L (ref 3.5–5.1)
Total Bilirubin: 0.6 mg/dL (ref 0.3–1.2)

## 2010-12-04 LAB — URINALYSIS, ROUTINE W REFLEX MICROSCOPIC
Glucose, UA: NEGATIVE mg/dL
Hgb urine dipstick: NEGATIVE
Protein, ur: NEGATIVE mg/dL
Specific Gravity, Urine: 1.02 (ref 1.005–1.030)

## 2010-12-04 LAB — DIFFERENTIAL
Basophils Absolute: 0 10*3/uL (ref 0.0–0.1)
Basophils Relative: 0 % (ref 0–1)
Neutro Abs: 6.7 10*3/uL (ref 1.7–7.7)
Neutrophils Relative %: 77 % (ref 43–77)

## 2010-12-04 LAB — URINE MICROSCOPIC-ADD ON

## 2010-12-04 LAB — CBC
HCT: 36.6 % (ref 36.0–46.0)
Hemoglobin: 12.3 g/dL (ref 12.0–15.0)
MCV: 84.7 fL (ref 78.0–100.0)
RDW: 13.8 % (ref 11.5–15.5)
WBC: 8.7 10*3/uL (ref 4.0–10.5)

## 2010-12-04 LAB — URINE CULTURE

## 2010-12-04 LAB — WET PREP, GENITAL: Yeast Wet Prep HPF POC: NONE SEEN

## 2010-12-04 LAB — GC/CHLAMYDIA PROBE AMP, GENITAL
Chlamydia, DNA Probe: NEGATIVE
GC Probe Amp, Genital: NEGATIVE

## 2010-12-16 LAB — POCT URINALYSIS DIP (DEVICE)
Glucose, UA: NEGATIVE mg/dL
Hgb urine dipstick: NEGATIVE
Protein, ur: 30 mg/dL — AB
Specific Gravity, Urine: 1.015 (ref 1.005–1.030)
Urobilinogen, UA: 4 mg/dL — ABNORMAL HIGH (ref 0.0–1.0)
pH: 7 (ref 5.0–8.0)

## 2010-12-17 LAB — URINE MICROSCOPIC-ADD ON

## 2010-12-17 LAB — WET PREP, GENITAL
Trich, Wet Prep: NONE SEEN
Yeast Wet Prep HPF POC: NONE SEEN

## 2010-12-17 LAB — URINALYSIS, ROUTINE W REFLEX MICROSCOPIC
Glucose, UA: NEGATIVE mg/dL
Ketones, ur: NEGATIVE mg/dL
Leukocytes, UA: NEGATIVE
pH: 6.5 (ref 5.0–8.0)

## 2010-12-17 LAB — GC/CHLAMYDIA PROBE AMP, GENITAL
Chlamydia, DNA Probe: NEGATIVE
GC Probe Amp, Genital: NEGATIVE

## 2010-12-17 LAB — POCT PREGNANCY, URINE: Preg Test, Ur: NEGATIVE

## 2010-12-21 LAB — POCT I-STAT, CHEM 8
BUN: 10 mg/dL (ref 6–23)
Calcium, Ion: 1.23 mmol/L (ref 1.12–1.32)
Chloride: 105 mEq/L (ref 96–112)
Sodium: 142 mEq/L (ref 135–145)

## 2010-12-24 LAB — POCT I-STAT, CHEM 8
BUN: 9 mg/dL (ref 6–23)
Creatinine, Ser: 1.2 mg/dL (ref 0.4–1.2)
Potassium: 3.1 mEq/L — ABNORMAL LOW (ref 3.5–5.1)
Sodium: 140 mEq/L (ref 135–145)

## 2010-12-24 LAB — D-DIMER, QUANTITATIVE: D-Dimer, Quant: 0.24 ug/mL-FEU (ref 0.00–0.48)

## 2010-12-24 LAB — CBC
HCT: 39.2 % (ref 36.0–46.0)
MCV: 82.8 fL (ref 78.0–100.0)
RBC: 4.73 MIL/uL (ref 3.87–5.11)
WBC: 9.6 10*3/uL (ref 4.0–10.5)

## 2010-12-24 LAB — DIFFERENTIAL
Lymphocytes Relative: 28 % (ref 12–46)
Lymphs Abs: 2.7 10*3/uL (ref 0.7–4.0)
Monocytes Relative: 5 % (ref 3–12)
Neutro Abs: 6.2 10*3/uL (ref 1.7–7.7)
Neutrophils Relative %: 65 % (ref 43–77)

## 2010-12-24 LAB — POCT CARDIAC MARKERS
CKMB, poc: 1.6 ng/mL (ref 1.0–8.0)
Myoglobin, poc: 72.8 ng/mL (ref 12–200)

## 2011-01-26 NOTE — Discharge Summary (Signed)
Eileen [name], Eileen [name]                  ACCOUNT NO.:  [id number]   MEDICAL RECORD NO.:  [id number]          PATIENT TYPE:  WOC   LOCATION:  WOC                          FACILITY:  [facility]   PHYSICIAN:  Allie Bossier, MD        DATE OF BIRTH:  [birth year]   DATE OF ADMISSION:  08/31/2007  DATE OF DISCHARGE:  09/01/2007                               DISCHARGE SUMMARY   ADMISSION DIAGNOSES:  1. Intrauterine pregnancy at 34 weeks, 1 day gestation.  2. Variable deceleration.  3. History of intrauterine fetal demise.  4. Chronic hypertension.  5. Recent molar pregnancy.   DISCHARGE DIAGNOSES:  1. Intrauterine pregnancy at 34 weeks, 1 day gestation.  2. Variable deceleration.  3. History of intrauterine fetal demise.  4. Chronic hypertension.  5. Recent molar pregnancy.   PROCEDURES:  Patient had an ultrasound performed on August 31, 2007  showing a single gestation in cephalic presentation with a posterior  fundal placenta above the cervical os.  AFI was 8.33.  Biophysical  profile was 8/8.  Growth was 16% percentile.   COMPLICATIONS:  None.   CONSULTATIONS:  Roney Mans in maternal fetal medicine.   PERTINENT LABORATORY FINDINGS:  Patient had a CBC on admission showing  white blood cell count 10.9, hemoglobin 12.4, hematocrit 36.2, platelets  184.   BRIEF PERTINENT ADMISSION HISTORY:  This is a 35 year old gravida 8,  para 4-1-2-4 at 34 weeks, 2 days gestation who was seen in the clinic  yesterday, at which time she had an NST that was reassuring but not  reactive.  Patient was sent for biophysical profile that was 8/8.  While  there, the strip was reviewed by Dr. Roney Mans in maternal fetal  medicine, who felt there was a variable deceleration on NST and  recommended admission for monitoring of the patient.   HOSPITAL COURSE:  Patient was admitted for monitoring with continuous  tocometry and fetal heart tones monitoring.  She had no complaints of  vaginal bleeding,  contractions, or decreased fetal movement.  During her  stay, she did have two variable decelerations, but otherwise had a  reactive strip.  The patient was discussed at rounds, and it was  determined that she should be discharged home in stable condition.  At  the time of discharge, her physical exam was normal.  An NST was  reactive.   DISCHARGE STATUS:  Stable.   DISCHARGE MEDICATIONS:  Patient is to continue home medications,  including labetalol 100 mg p.o. twice daily and prenatal vitamins 1  tablet p.o. daily.   DISCHARGE INSTRUCTIONS:  1. Discharge home.  2. Regular diet.  3. Activity as tolerated.  4. Nothing in the vagina for the remainder of pregnancy.  5. Patient is to follow up at her high risk clinic appointment that      has been previously scheduled on September 04, 2007.      Karlton Lemon, MD  Electronically Signed     ______________________________  Allie Bossier, MD    NS/MEDQ  D:  09/01/2007  T:  09/01/2007  Job:  [id number]

## 2011-01-26 NOTE — Group Therapy Note (Signed)
Eileen [name], Eileen [name] NO.:  [id number]   MEDICAL RECORD NO.:  [id number]          PATIENT TYPE:  WOC   LOCATION:  [facility]                   FACILITY:  [facility]   PHYSICIAN:  Argentina Donovan, MD        DATE OF BIRTH:  December 07, 1975   DATE OF SERVICE:  10/11/2007                                  CLINIC NOTE   The patient is a 35 year old gravida 8, para 5-0-3-5 who delivered a  baby by normal vaginal delivery on September 13, 2007.  She came in today  to discuss tubal ligation and we must get it done before her 6-week  checkup is due which gives [place] two more weeks.  We discussed the  procedure of laparoscopic sterilization as well as talking about  vasectomy and other alternative methods of some of contraception.  She  has decided she wants to go with the laparoscopic tubal ligation.  We  have discussed the complications, especially those related to anesthesia  and those related to injury to bowel, urinary tract, intraoperative and  postoperative hemorrhage,  herniation and infection. The patient had no  further questions at the end of examination and I think she is a good  candidate for this.  She does take labetalol for blood pressure and  hydrochlorothiazide, but she is on no other medications.   IMPRESSION:  Is preop for bilateral tubal ligation.           ______________________________  Argentina Donovan, MD     PR/MEDQ  D:  10/11/2007  T:  10/11/2007  Job:  [id number]

## 2011-01-29 NOTE — H&P (Signed)
[facility]  Patient:    Eileen [name], Eileen [name]                       MRN: [id number] Adm. Date:  [id number] Disc. Date: [id number] Attending:  Tammi Sou Dictator:   Roseanna Rainbow, M.D. CC:         [facility] Teaching Service   History and Physical  CHIEF COMPLAINT:              The patient is a 35 year old para 4, who desires a sterilization procedure.  HISTORY OF PRESENT ILLNESS:   The patient is currently using Depo-Provera. The risks, benefits and alternative forms of management were reviewed with the patient including, but not limited to, a risk of failure of 4 to 8 per 1000 cases with a subsequent increased risk of an ectopic gestation.  ALLERGIES:                    PENICILLIN.  MEDICATIONS:                  Iron supplements.  PAST OBSTETRIC/GYNECOLOGIC HISTORY:                      She is status post one cesarean delivery, three spontaneous vaginal deliveries, a spontaneous abortion followed by a suction D&C.  She denies any sexually transmitted diseases.  PAST SURGICAL HISTORY:        As above.  PAST MEDICAL HISTORY:         She denies.  SOCIAL HISTORY:               She denies.  FAMILY HISTORY:               She denies.  PHYSICAL EXAMINATION:  VITAL SIGNS:                  Blood pressure is 140/90, previous pressure was 136/77.  GENERAL APPEARANCE:           Well developed, well nourished, no apparent distress.  HEAD, EYES, EARS, NOSE, AND THROAT:                       Normocephalic, atraumatic.  NECK:                         Supple.  LUNGS:                        Clear to auscultation bilaterally.  HEART:                        Regular rate and rhythm.  BREASTS:                      Deferred.  ABDOMEN:                      Soft, nontender, normoactive bowel sounds, no organomegaly.  PELVIC EXAM:                  On exam of the external female genitalia,  the external female genitalia are normal appearing.  On speculum exam, the vagina is clear; a Pap smear, GC and Chlamydia probes are taken.  On bimanual exam, the uterus is small, retroverted, nontender.  The adnexa are nonpalpable and nontender.  RECTAL EXAM:                  Deferred.  EXTREMITIES:                  No clubbing, cyanosis or edema.  SKIN:                         Without rash.  ASSESSMENT:                   Multiparity, desires permanent sterilization.  PLAN:                         Will recheck the blood pressure today as it is borderline elevated; if it persists, will refer her to internal medicine. Will check the GC and Chlamydia probes and Pap smear performed today.  The planned procedure is laparoscopic bilateral tubal ligation. DD:  03/31/01 TD:  03/31/01 Job: [id number]

## 2011-01-29 NOTE — Discharge Summary (Signed)
NAME:  Eileen [name], Eileen [name]                          ACCOUNT NO.:  [id number]   MEDICAL RECORD NO.:  [id number]                   PATIENT TYPE:  INP   LOCATION:  9307                                 [facility]   PHYSICIAN:  Conni Elliot, M.D.             DATE OF BIRTH:  21-Oct-1975   DATE OF ADMISSION:  12/25/2003  DATE OF DISCHARGE:                                 DISCHARGE SUMMARY   LABORATORY DATA:  White blood cell count 11.2, hemoglobin 12.5, hematocrit  37.3, platelet count 217.   GBS positive.   Group and Rh B positive.   STUDIES:  None.   HOSPITAL COURSE:  The patient is a 35 year old G5 P4-0-1-4 who presented to  the medical admissions unit with menstrual cramping and bleeding.  Upon  speculum exam there was a bulging bag and head at her perineum.  Ultrasound  was performed and showed no fetal heart tones at the time and she was to be  transferred to labor and delivery to start Pitocin.  Before she got to labor  and delivery she delivered spontaneously in medical admissions unit.  She  was admitted overnight for observation and had an anxiety attack overnight  which she recovered from without problem.  On the morning of discharge she  was doing as well as could be expected emotionally and stated that she  wanted to go home as she needed to take care of her other children.   MEDICATIONS AT DISCHARGE:  1. Ibuprofen 600 mg p.o. q.6h. p.r.n. cramping and pain.  2. Prenatal vitamins one p.o. daily.  3. Depo-Provera shot for contraception.   FOLLOW-UP VISIT:  The patient is to follow up at [facility] in 1  weeks.     Miles Costain, M.D.                          Conni Elliot, M.D.    DY/MEDQ  D:  12/26/2003  T:  12/26/2003  Job:  [id number]

## 2011-01-29 NOTE — Group Therapy Note (Signed)
[name], [name] NO.:  [id number]   MEDICAL RECORD NO.:  [id number]          PATIENT TYPE:  WOC   LOCATION:  [facility]                   FACILITY:  [facility]   PHYSICIAN:  Elsie Lincoln, MD      DATE OF BIRTH:  Feb 27, 1976   DATE OF SERVICE:  05/04/2006                                    CLINIC NOTE   HISTORY OF PRESENT ILLNESS:  The patient is a 35 year old female who  underwent CBC by Dr. Penne Lash on April 26, 2006.  The patient's pathology  came back complete.  I did a foreign mole.  The patient has had minimal  bleeding since then and is doing well today.  I explained the diagnosis and  that she would need weekly beta HCG's until it went to 0 and then negative  beta HCG's for six months thereafter.  She also needs a reliable form of  contraception, and she should use Depo Provera.   PAST MEDICAL HISTORY:  Hypertension.  Today her blood pressure is 132/96.  She is not on medication because she cannot afford it.  We will keep a  monitor of her blood pressure, and if it starts to increase, I will  prescribe something off the $4 target list for her.  If her beta HCG's  plateau or rise according to __________guidelines, I will transfer her to  [facility] where they can manage her disease at that point.  The  patient will come back in one week.  She understands the gravity of the  situation.  Agrees not to become pregnant.  She is going to be absent for  the next two weeks as the Depo takes effect, and agrees to come for weekly  HCG's.  We also have a reliable phone number as the patient reiterates  today.           ______________________________  Elsie Lincoln, MD     KL/MEDQ  D:  05/04/2006  T:  05/05/2006  Job:  [id number]

## 2011-01-29 NOTE — Consult Note (Signed)
Eileen [name], Eileen [name]                  ACCOUNT NO.:  [id number]   MEDICAL RECORD NO.:  [id number]           PATIENT TYPE:   LOCATION:                                 FACILITY:   PHYSICIAN:  John T. Kyla Balzarine, M.D.         DATE OF BIRTH:   DATE OF CONSULTATION:  DATE OF DISCHARGE:                                   CONSULTATION   CHIEF COMPLAINT:  This 35 year old para 4-0-3-4 is seen at the request of  Dr. 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   HISTORY OF PRESENT ILLNESS:  The patient had SAB approximately two years ago  and was evacuated of complete mole on April 26, 2006. Pre evacuation HCG  was approximately 196,000 MIU/ML. The patient notes that she was told she  had an ovarian cyst around the time of the evacuation. HCG values rapidly  fell to a nadir of 63 on September 5, but subsequently rose to 84 on  September 13. No other confirmatory HCG values after the 84. The patient  notes that she had heavy bleeding for the first two weeks after molar  evacuation. She subsequently has had scant pink discharge with no spotting  over the past two weeks. She does have a dragging right lower quadrant  discomfort exacerbated by activity. She notes low grade morning nausea.   PAST MEDICAL HISTORY:  Significant for hypertension recently started on  Diovan. She is status post cesarean section and D&C's.   MEDICATIONS:  Diovan.   ALLERGIES:  PENICILLIN causes hives.   PERSONAL AND SOCIAL HISTORY:  The patient is married and works as a  Advertising copywriter. She denies tobacco, EtOH or drug use.   FAMILY HISTORY:  Negative for molar gestation or cancer.   REVIEW OF SYSTEMS:  Otherwise, negative in the remainder obtained 10  systems.   PHYSICAL EXAMINATION:  VITAL SIGNS:  Weight 167 pounds, blood pressure  132/98, pulse 80, respirations 18, temperature afebrile.  GENERAL:  The patient is anxious, alert, and oriented x3  in no acute  distress.  HEENT:  Benign with clear oropharynx with no scleral icterus and full  extraocular movements.  NECK:  Supple without goiter.  LUNGS:  Fields are clear.  LYMPHADENOPATHY:  Lymph survey is negative.  BACK:  There is no back or CVA tenderness.  ABDOMEN:  Scaphoid, soft and benign with well-healed Pfannenstiel incision.  No hernia, ascites, tenderness or mass.  EXTREMITIES:  Full strength and range of motion without edema.  SKIN:  No suspicious lesions.  PELVIC:  External genitalia and BUS are normal to inspection and palpation.  Bladder  and urethra are normal. The cervix is deviated anterior. There are  no vaginal or cervical mucosal lesions and no cervical motion tenderness.  Bimanual and rectovaginal examinations reveal upper limit size uterus in the  mid posterior orientation with a 4 cm vaguely cystic and slightly tender  right ovary. Left adnexa negative and no cul-de-sac nodularity.   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   PLAN:  Because the single rise in HCG does not meet the criteria for  malignant GTN, we will repeat an HCG value and obtain a CBC and complete  metabolic panel to assess renal and liver functions. Chest  x-ray will be obtained as a screen. If her HCG is increased greater than 10%  (greater than 8 MIU/ML) on today's assay I would recommend treatment with  weekly methotrexate 35 mg/sq m unless the patient has pulmonary metastases  on chest x-ray. If she had pulmonary nodules, she would require  comprehensive staging. If HCG values were plateaued (plus/minus 10% or  stable or falling more than 8 MIU/AML) I would recommend that the patient be  followed with weekly HCG values, and treatment would be withheld unless she  met criteria for malignant GTN (rising HCG values on consecutive assays  obtained over 14 days or plateau over 21 days on weekly assays).      John T. Kyla Balzarine, M.D.   Electronically Signed     JTS/MEDQ  D:  05/31/2006  T:  06/01/2006  Job:  [id number]   cc:   Lesly Dukes, M.D.   Telford Nab, R.N.  [address]  [place], [place] [address]

## 2011-01-29 NOTE — Op Note (Signed)
Eileen [name], Eileen [name]                  ACCOUNT NO.:  [id number]   MEDICAL RECORD NO.:  [id number]          PATIENT TYPE:  AMB   LOCATION:  SDC                           [facility]   PHYSICIAN:  Lesly Dukes, M.D. DATE OF BIRTH:  01-29-1976   DATE OF PROCEDURE:  04/26/2006  DATE OF DISCHARGE:                                 OPERATIVE REPORT   PREOPERATIVE DIAGNOSIS:  35 year old female with missed abortion versus  molar pregnancy.   POSTOPERATIVE DIAGNOSIS:  35 year old female with missed abortion versus  molar pregnancy.   OPERATION PERFORMED:  Dilation and evacuation with curettage.   SURGEON:  Lesly Dukes, M.D.   ANESTHESIA:  MAC and local.   PATHOLOGY:  Endometrial curettings.   ESTIMATED BLOOD LOSS:  200 mL.   COMPLICATIONS:  None.   FINDINGS:  Approximately 10-week size uterus, prior to procedure; abnormal  size retroverted uterus after procedure.   DESCRIPTION OF PROCEDURE:  After informed consent was obtained, the patient  was taken to the operating room and MAC anesthesia was induced.  The patient  was placed in dorsal lithotomy position and the prepared and draped in the  normal sterile fashion.  A Foley catheter was placed into the bladder.  Bimanual was performed with the above findings.  A bivalve speculum was  placed into vagina.  The anterior lip of the cervix was grasped with a  single toothed tenaculum.  The cervical os was gently dilated to a #10 with  Hegar dilators.  A #10 curved curette was gently introduced into the uterus  and suction curettage was performed.  Suction curette was removed and a  sharp curettage was performed and a good cry was felt on all sides of the  uterus.  The sharp curette was removed and one last pass was performed with  the suction curette to ensure that all products of conception were removed  from the uterus.  All instruments were removed from the patient's vagina.  There was good hemostasis in the cervix.  The  patient tolerated the  procedure well.  Sponge, lap, needle and instruments were all correct times  two.  She was then taken to recovery room in stable condition.           ______________________________  Lesly Dukes, M.D.     KHL/MEDQ  D:  04/26/2006  T:  04/26/2006  Job:  [id number]

## 2011-05-19 ENCOUNTER — Emergency Department (HOSPITAL_COMMUNITY): Payer: Self-pay

## 2011-05-19 ENCOUNTER — Emergency Department (HOSPITAL_COMMUNITY)
Admission: EM | Admit: 2011-05-19 | Discharge: 2011-05-19 | Disposition: A | Discharge status: Home / Self Care | Payer: Self-pay | Attending: Emergency Medicine | Admitting: Emergency Medicine

## 2011-05-19 DIAGNOSIS — M79609 Pain in unspecified limb: Secondary | ICD-10-CM | POA: Insufficient documentation

## 2011-05-19 DIAGNOSIS — M25519 Pain in unspecified shoulder: Secondary | ICD-10-CM | POA: Insufficient documentation

## 2011-05-19 DIAGNOSIS — I10 Essential (primary) hypertension: Secondary | ICD-10-CM | POA: Insufficient documentation

## 2011-05-19 LAB — COMPREHENSIVE METABOLIC PANEL
Albumin: 3.6 g/dL (ref 3.5–5.2)
Alkaline Phosphatase: 73 U/L (ref 39–117)
BUN: 17 mg/dL (ref 6–23)
Chloride: 102 mEq/L (ref 96–112)
Creatinine, Ser: 1.02 mg/dL (ref 0.50–1.10)
GFR calc Af Amer: >60 mL/min (ref >60)
Glucose, Bld: 98 mg/dL (ref 70–99)
Potassium: 3.5 mEq/L (ref 3.5–5.1)
Total Bilirubin: 0.4 mg/dL (ref 0.3–1.2)
Total Protein: 6.8 g/dL (ref 6.0–8.3)

## 2011-05-19 LAB — D-DIMER, QUANTITATIVE: D-Dimer, Quant: 0.26 ug/mL-FEU (ref 0.00–0.48)

## 2011-05-19 LAB — POCT I-STAT TROPONIN I: Troponin i, poc: 0 ng/mL (ref 0.00–0.08)

## 2011-06-03 LAB — CBC
MCV: 85.5
Platelets: 174
RBC: 3.99
WBC: 8.1

## 2011-06-18 LAB — CBC
HCT: 34.6 — ABNORMAL LOW
HCT: 36.2
Hemoglobin: 12
Hemoglobin: 12.4
MCV: 85.5
RBC: 4.23
WBC: 10.9 — ABNORMAL HIGH
WBC: 9.5

## 2011-06-18 LAB — POCT URINALYSIS DIP (DEVICE)
Bilirubin Urine: NEGATIVE
Ketones, ur: NEGATIVE
Ketones, ur: NEGATIVE
Ketones, ur: NEGATIVE
Operator id: 120861
Operator id: 194561
Specific Gravity, Urine: 1.02
Specific Gravity, Urine: >=1.03
Specific Gravity, Urine: >=1.03
pH: 6
pH: 6

## 2011-06-18 LAB — URINE CULTURE

## 2011-06-18 LAB — URINALYSIS, ROUTINE W REFLEX MICROSCOPIC
Bilirubin Urine: NEGATIVE
Glucose, UA: NEGATIVE
Ketones, ur: NEGATIVE
Nitrite: NEGATIVE
Specific Gravity, Urine: <1.005 — ABNORMAL LOW
pH: 6.5

## 2011-06-18 LAB — URINE MICROSCOPIC-ADD ON

## 2011-06-21 LAB — POCT URINALYSIS DIP (DEVICE)
Bilirubin Urine: NEGATIVE
Glucose, UA: NEGATIVE
Glucose, UA: NEGATIVE
Ketones, ur: NEGATIVE
Specific Gravity, Urine: 1.02
Specific Gravity, Urine: 1.025

## 2011-06-22 LAB — POCT URINALYSIS DIP (DEVICE)
Bilirubin Urine: NEGATIVE
Bilirubin Urine: NEGATIVE
Glucose, UA: NEGATIVE
Glucose, UA: NEGATIVE
Glucose, UA: NEGATIVE
Hgb urine dipstick: NEGATIVE
Nitrite: NEGATIVE
Nitrite: NEGATIVE
Nitrite: NEGATIVE
Specific Gravity, Urine: 1.025
Specific Gravity, Urine: >=1.03

## 2011-06-23 LAB — POCT URINALYSIS DIP (DEVICE)
Bilirubin Urine: NEGATIVE
Glucose, UA: NEGATIVE
Nitrite: NEGATIVE

## 2011-06-24 LAB — POCT URINALYSIS DIP (DEVICE)
Glucose, UA: NEGATIVE
Nitrite: NEGATIVE
Operator id: 297281
Urobilinogen, UA: 1

## 2011-06-24 LAB — LUPUS ANTICOAGULANT PANEL: Lupus Anticoagulant: NOT DETECTED

## 2011-06-24 LAB — ALT: ALT: 14

## 2011-06-24 LAB — CARDIOLIPIN ANTIBODIES, IGM+IGG
Anticardiolipin IgG: <7 — ABNORMAL LOW (ref <11)
Anticardiolipin IgM: <7 — ABNORMAL LOW (ref <10)

## 2011-06-29 LAB — URINE MICROSCOPIC-ADD ON

## 2011-06-29 LAB — URINALYSIS, ROUTINE W REFLEX MICROSCOPIC
Glucose, UA: NEGATIVE
Nitrite: NEGATIVE
Specific Gravity, Urine: 1.025
pH: 6

## 2011-06-30 LAB — CBC
Hemoglobin: 12.8
RBC: 4.59
RDW: 13.9
WBC: 7.9

## 2011-06-30 LAB — URINALYSIS, ROUTINE W REFLEX MICROSCOPIC
Ketones, ur: NEGATIVE
Nitrite: NEGATIVE
Specific Gravity, Urine: 1.02
pH: 6

## 2011-06-30 LAB — POCT PREGNANCY, URINE: Operator id: 275371

## 2011-08-18 ENCOUNTER — Emergency Department (HOSPITAL_COMMUNITY)
Admission: EM | Admit: 2011-08-18 | Discharge: 2011-08-19 | Disposition: A | Discharge status: Home / Self Care | Payer: Self-pay | Attending: Emergency Medicine | Admitting: Emergency Medicine

## 2011-08-18 ENCOUNTER — Encounter: Payer: Self-pay | Admitting: Emergency Medicine

## 2011-08-18 DIAGNOSIS — I10 Essential (primary) hypertension: Secondary | ICD-10-CM | POA: Insufficient documentation

## 2011-08-18 DIAGNOSIS — A599 Trichomoniasis, unspecified: Secondary | ICD-10-CM | POA: Insufficient documentation

## 2011-08-18 DIAGNOSIS — R1032 Left lower quadrant pain: Secondary | ICD-10-CM | POA: Insufficient documentation

## 2011-08-18 DIAGNOSIS — Z79899 Other long term (current) drug therapy: Secondary | ICD-10-CM | POA: Insufficient documentation

## 2011-08-18 DIAGNOSIS — R110 Nausea: Secondary | ICD-10-CM | POA: Insufficient documentation

## 2011-08-18 DIAGNOSIS — N898 Other specified noninflammatory disorders of vagina: Secondary | ICD-10-CM | POA: Insufficient documentation

## 2011-08-18 HISTORY — DX: Essential (primary) hypertension: I10

## 2011-08-18 LAB — POCT PREGNANCY, URINE: Preg Test, Ur: NEGATIVE

## 2011-08-18 LAB — CBC
MCV: 78.3 fL (ref 78.0–100.0)
Platelets: 236 10*3/uL (ref 150–400)
RBC: 4.92 MIL/uL (ref 3.87–5.11)
WBC: 12.2 10*3/uL — ABNORMAL HIGH (ref 4.0–10.5)

## 2011-08-18 LAB — DIFFERENTIAL
Eosinophils Relative: 1 % (ref 0–5)
Lymphocytes Relative: 35 % (ref 12–46)
Lymphs Abs: 4.3 10*3/uL — ABNORMAL HIGH (ref 0.7–4.0)

## 2011-08-18 MED ORDER — ACETAMINOPHEN 500 MG PO TABS
1000.0000 mg | ORAL_TABLET | ORAL | Status: AC
  Administered 2011-08-18: 1000 mg via ORAL
  Filled 2011-08-18: qty 2

## 2011-08-18 NOTE — ED Notes (Signed)
MD at bedside. (Dr. Miller) 

## 2011-08-18 NOTE — ED Provider Notes (Signed)
History     CSN: [id number] Arrival date & time: 08/18/2011  8:12 PM   First MD Initiated Contact with Patient 08/18/11 2303      Chief Complaint  Patient presents with  . Flank Pain    (Consider location/radiation/quality/duration/timing/severity/associated sxs/prior treatment) HPI Comments: Eileen [name] 35 y.o. female   Chief Complaint: Abdominal Pain  Information is obtained from : patient  Onset of the pain was gradual, Pain is located at LLQ, Timing intermittent, Course worsened, Severity mild-moderate, , Quality is sharp, Alleviating - nothing /Agravating thinking about difficulty relationship with husband, associated symptoms nausea and vaginal discharge No significant heartburn, No significant change in appetite, No hematemesis, No blood in stools or black tarry stools, No abdominal bloating or early satiety, Positive for change in bowel habits - had diarrhea last week, has resolved and had normal BM today  Associated Rectal Bleeding: none Associated Urinary Symptoms: none Associated vaginal dischanged - yes - hx of STD  Prior Surgery: the following surgeries: C section  Use of Steroids none Use of NSAIDs none Alcohol intake none  PMD ?   Patient is a 35 y.o. female presenting with flank pain. The history is provided by the patient.  Flank Pain    Past Medical History  Diagnosis Date  . Hypertension     Past Surgical History  Procedure Date  . Cesarean section     No family history on file.  History  Substance Use Topics  . Smoking status: Never Smoker   . Smokeless tobacco: Not on file  . Alcohol Use: No    OB History    Grav Para Term Preterm Abortions TAB SAB Ect Mult Living                  Review of Systems  Genitourinary: Positive for flank pain.  All other systems reviewed and are negative.    Allergies  Penicillins  Home Medications   Current Outpatient Rx  Name Route Sig Dispense Refill  . LABETALOL HCL 100 MG PO TABS  Oral Take 100 mg by mouth 2 (two) times daily.      Marland Kitchen METOPROLOL TARTRATE 25 MG PO TABS Oral Take 25 mg by mouth 2 (two) times daily.      Marland Kitchen DOXYCYCLINE HYCLATE 100 MG PO CAPS Oral Take 1 capsule (100 mg total) by mouth 2 (two) times daily. 20 capsule 0  . METRONIDAZOLE 500 MG PO TABS Oral Take 1 tablet (500 mg total) by mouth 2 (two) times daily. 20 tablet 0    BP 128/86  Pulse 100  Temp(Src) 97.6 F (36.4 C) (Oral)  Resp 18  SpO2 100%  LMP 08/14/2011  Physical Exam  Nursing note and vitals reviewed. Constitutional: She appears well-developed and well-nourished. No distress.  HENT:  Head: Normocephalic and atraumatic.  Mouth/Throat: Oropharynx is clear and moist. No oropharyngeal exudate.  Eyes: Conjunctivae and EOM are normal. Pupils are equal, round, and reactive to light. Right eye exhibits no discharge. Left eye exhibits no discharge. No scleral icterus.  Neck: Normal range of motion. Neck supple. No JVD present. No thyromegaly present.  Cardiovascular: Normal rate, regular rhythm, normal heart sounds and intact distal pulses.  Exam reveals no gallop and no friction rub.   No murmur heard. Pulmonary/Chest: Effort normal and breath sounds normal. No respiratory distress. She has no wheezes. She has no rales.  Abdominal: Soft. Bowel sounds are normal. She exhibits no distension and no mass. There is tenderness ( mild LLQ  ttp without guarding or masses).       No CVA ttp  Genitourinary:       Chaperone present for exam,  External genitalia normal in appearance, thin and frothy vaginal discharge with slight odor, no bleeding, cervical os closed, no cervical motion tenderness or adnexal tenderness or masses  Musculoskeletal: Normal range of motion. She exhibits no edema and no tenderness.  Lymphadenopathy:    She has no cervical adenopathy.  Neurological: She is alert. Coordination normal.  Skin: Skin is warm and dry. No rash noted. No erythema.  Psychiatric: Her behavior is  normal.       Tearful when discussing husband - denies safety concerns and states he does not live with her    ED Course  Procedures (including critical care time)  Labs Reviewed  CBC - Abnormal; Notable for the following:    WBC 12.2 (*)    MCHC 36.4 (*)    All other components within normal limits  DIFFERENTIAL - Abnormal; Notable for the following:    Lymphs Abs 4.3 (*)    All other components within normal limits  BASIC METABOLIC PANEL - Abnormal; Notable for the following:    Glucose, Bld 115 (*)    Creatinine, Ser 1.12 (*)    GFR calc non Af Amer 63 (*)    GFR calc Af Amer 73 (*)    All other components within normal limits  URINALYSIS, ROUTINE W REFLEX MICROSCOPIC - Abnormal; Notable for the following:    APPearance CLOUDY (*)    Hgb urine dipstick SMALL (*)    Leukocytes, UA MODERATE (*)    All other components within normal limits  URINE MICROSCOPIC-ADD ON - Abnormal; Notable for the following:    Squamous Epithelial / LPF MANY (*)    Bacteria, UA FEW (*)    All other components within normal limits  POCT PREGNANCY, URINE  POCT PREGNANCY, URINE   No results found.   1. Trichomonas   2. Abdominal pain       MDM  Pt concerned about LLQ pain that is flucuating, started with onset of menses last week but continues to come in waves - has minimal ttp on abd exam - pelvic exam with no tenderness has had a vaginal discharge. Urinalysis and labs pending.  Results showed Trichomonas, urinalysis otherwise without significant urinary infection, white blood cell count slightly elevated, basic metabolic panel with normal renal function and electrolytes. She informed of results and need for further testing treatment of her self and partners. 2 g of Zithromax given due 2 penicillin anaphylaxis allergy, doxycycline and Flagyl for home     Vida Roller, MD 08/19/11 [phone]

## 2011-08-18 NOTE — ED Notes (Signed)
Pt states history of ulcer

## 2011-08-18 NOTE — ED Notes (Signed)
PT. REPORTS LEFT FLANK / LEFT ABDOMINAL PAIN X 1 WEEK ,  NAUSEA , NO VOMITTING , DIARHHEA X4 ,  DENIES FEVER OR CHILLS.

## 2011-08-18 NOTE — ED Notes (Signed)
Pt c/o intermittent left sided abdominal pain starting last week. Pt currently on MP. Last BM today. Denies difficulty voiding. PT experiencing nausea, denies vomiting

## 2011-08-19 LAB — URINALYSIS, ROUTINE W REFLEX MICROSCOPIC
Bilirubin Urine: NEGATIVE
Glucose, UA: NEGATIVE mg/dL
Ketones, ur: NEGATIVE mg/dL
Nitrite: NEGATIVE
Protein, ur: NEGATIVE mg/dL
Specific Gravity, Urine: 1.011 (ref 1.005–1.030)
Urobilinogen, UA: 0.2 mg/dL (ref 0.0–1.0)
pH: 6.5 (ref 5.0–8.0)

## 2011-08-19 LAB — URINE MICROSCOPIC-ADD ON

## 2011-08-19 LAB — BASIC METABOLIC PANEL WITH GFR
BUN: 17 mg/dL (ref 6–23)
CO2: 27 meq/L (ref 19–32)
Calcium: 9.6 mg/dL (ref 8.4–10.5)
Chloride: 100 meq/L (ref 96–112)
Creatinine, Ser: 1.12 mg/dL — ABNORMAL HIGH (ref 0.50–1.10)
GFR calc Af Amer: 73 mL/min — ABNORMAL LOW
GFR calc non Af Amer: 63 mL/min — ABNORMAL LOW
Glucose, Bld: 115 mg/dL — ABNORMAL HIGH (ref 70–99)
Potassium: 3.5 meq/L (ref 3.5–5.1)
Sodium: 137 meq/L (ref 135–145)

## 2011-08-19 MED ORDER — DOXYCYCLINE HYCLATE 100 MG PO CAPS: 100.0000 mg | ORAL_CAPSULE | Freq: Two times a day (BID) | ORAL | Status: AC

## 2011-08-19 MED ORDER — DOXYCYCLINE HYCLATE 100 MG PO CAPS: 100.0000 mg | ORAL_CAPSULE | Freq: Two times a day (BID) | ORAL | Status: DC

## 2011-08-19 MED ORDER — CEFTRIAXONE SODIUM 250 MG IJ SOLR: 250.0000 mg | Freq: Once | INTRAMUSCULAR | Status: DC

## 2011-08-19 MED ORDER — AZITHROMYCIN 250 MG PO TABS
2000.0000 mg | ORAL_TABLET | Freq: Once | ORAL | Status: AC
  Administered 2011-08-19: 2000 mg via ORAL
  Filled 2011-08-19 (×2): qty 4

## 2011-08-19 MED ORDER — METRONIDAZOLE 500 MG PO TABS: 500.0000 mg | ORAL_TABLET | Freq: Two times a day (BID) | ORAL | Status: AC

## 2011-08-19 MED ORDER — METRONIDAZOLE 500 MG PO TABS: 500.0000 mg | ORAL_TABLET | Freq: Two times a day (BID) | ORAL | Status: DC

## 2011-10-16 ENCOUNTER — Inpatient Hospital Stay (HOSPITAL_COMMUNITY)
Admission: AD | Admit: 2011-10-16 | Discharge: 2011-10-16 | Disposition: A | Discharge status: Home / Self Care | Payer: Self-pay | Source: Ambulatory Visit | Attending: Obstetrics and Gynecology | Admitting: Obstetrics and Gynecology

## 2011-10-16 ENCOUNTER — Inpatient Hospital Stay (HOSPITAL_COMMUNITY): Payer: Self-pay

## 2011-10-16 ENCOUNTER — Encounter (HOSPITAL_COMMUNITY): Payer: Self-pay | Admitting: *Deleted

## 2011-10-16 DIAGNOSIS — O021 Missed abortion: Secondary | ICD-10-CM | POA: Insufficient documentation

## 2011-10-16 DIAGNOSIS — I10 Essential (primary) hypertension: Secondary | ICD-10-CM | POA: Insufficient documentation

## 2011-10-16 DIAGNOSIS — A5901 Trichomonal vulvovaginitis: Secondary | ICD-10-CM

## 2011-10-16 DIAGNOSIS — R109 Unspecified abdominal pain: Secondary | ICD-10-CM | POA: Insufficient documentation

## 2011-10-16 DIAGNOSIS — IMO0002 Reserved for concepts with insufficient information to code with codable children: Secondary | ICD-10-CM | POA: Insufficient documentation

## 2011-10-16 HISTORY — DX: Trichomoniasis, unspecified: A59.9

## 2011-10-16 HISTORY — DX: Panic disorder (episodic paroxysmal anxiety): F41.0

## 2011-10-16 HISTORY — DX: Blighted ovum and nonhydatidiform mole: O02.0

## 2011-10-16 LAB — CBC
Hemoglobin: 12.3 g/dL (ref 12.0–15.0)
MCH: 27.7 pg (ref 26.0–34.0)
MCHC: 34.1 g/dL (ref 30.0–36.0)
Platelets: 255 10*3/uL (ref 150–400)
RDW: 14 % (ref 11.5–15.5)

## 2011-10-16 LAB — URINALYSIS, MICROSCOPIC ONLY
Glucose, UA: NEGATIVE mg/dL
Ketones, ur: NEGATIVE mg/dL
Protein, ur: NEGATIVE mg/dL

## 2011-10-16 LAB — WET PREP, GENITAL: Yeast Wet Prep HPF POC: NONE SEEN

## 2011-10-16 MED ORDER — ONDANSETRON HCL 4 MG PO TABS
4.0000 mg | ORAL_TABLET | Freq: Once | ORAL | Status: AC
  Administered 2011-10-16: 4 mg via ORAL
  Filled 2011-10-16: qty 1

## 2011-10-16 MED ORDER — METRONIDAZOLE 500 MG PO TABS
2000.0000 mg | ORAL_TABLET | Freq: Once | ORAL | Status: AC
  Administered 2011-10-16: 2000 mg via ORAL
  Filled 2011-10-16: qty 4

## 2011-10-16 MED ORDER — METOPROLOL TARTRATE 25 MG PO TABS
25.0000 mg | ORAL_TABLET | Freq: Once | ORAL | Status: AC
  Administered 2011-10-16: 25 mg via ORAL
  Filled 2011-10-16: qty 1

## 2011-10-16 MED ORDER — METOPROLOL TARTRATE 50 MG PO TABS: 50.0000 mg | ORAL_TABLET | Freq: Two times a day (BID) | ORAL | Status: DC

## 2011-10-16 MED ORDER — IBUPROFEN 600 MG PO TABS: 600.0000 mg | ORAL_TABLET | Freq: Four times a day (QID) | ORAL | Status: AC | PRN

## 2011-10-16 MED ORDER — LABETALOL HCL 100 MG PO TABS
100.0000 mg | ORAL_TABLET | Freq: Once | ORAL | Status: AC
  Administered 2011-10-16: 100 mg via ORAL
  Filled 2011-10-16: qty 1

## 2011-10-16 MED ORDER — PROMETHAZINE HCL 25 MG PO TABS: 25.0000 mg | ORAL_TABLET | Freq: Four times a day (QID) | ORAL | Status: AC | PRN

## 2011-10-16 NOTE — Progress Notes (Signed)
Lower abdominal cramping started yesterday around 2PM. Thinks she has not had a period since October. C/O breast tenderness. 1 +HPT. Has hx molar pregnancy and states she is frightened. Did not want another pregnancy. States she has high BP and is supposed to be on medicine, but does not have a doctor and cannot get her medicine. States she has no one to help her. Patient crying.

## 2011-10-16 NOTE — ED Provider Notes (Signed)
History   Eileen [name] is a 35 y.o. year old [id number] female at [redacted]w[redacted]d weeks gestation by uncertain LMP who presents to MAU reporting bilat low abd pain that comes and goes since yesterday. Pain was 10/10 at its greatest, but is now 2/10. Pain radiates to left low back. She denies VB. She has Hx of CHTN and usually [facility], but has not been able to go due to financial issues. She states she will be able to go this month.  CSN: [id number]  Arrival date & time 10/16/11  0712   None     Chief Complaint  Patient presents with  . Abdominal Pain    (Consider location/radiation/quality/duration/timing/severity/associated sxs/prior treatment) HPI  Past Medical History  Diagnosis Date  . Hypertension   . Panic attacks     " a long time ago"  . Molar pregnancy   . Stillbirth   . Trichomonas     Past Surgical History  Procedure Date  . Cesarean section     History reviewed. No pertinent family history.  History  Substance Use Topics  . Smoking status: Never Smoker   . Smokeless tobacco: Never Used  . Alcohol Use: No    OB History    Grav Para Term Preterm Abortions TAB SAB Ect Mult Living   9 7 7  1  1   6       Review of Systems  Constitutional: Negative for fever and chills.  Respiratory: Negative for shortness of breath.   Cardiovascular: Negative for chest pain.  Gastrointestinal: Positive for abdominal pain. Negative for nausea and vomiting.  Genitourinary: Positive for vaginal discharge. Negative for vaginal bleeding.  Psychiatric/Behavioral: The patient is nervous/anxious.     Allergies  Penicillins  Home Medications  No current outpatient prescriptions on file.  BP 147/102  Pulse 86  Temp(Src) 98.7 F (37.1 C) (Oral)  Resp 16  Ht 5\' 4"  (1.626 m)  Wt 78.472 kg (173 lb)  BMI 29.70 kg/m2  LMP 08/11/2011  No data found.   Physical Exam  Constitutional: She is oriented to person, place, and time. She appears well-developed and  well-nourished. She appears distressed (emotionaly).  Cardiovascular: Normal rate.   Pulmonary/Chest: Effort normal.  Abdominal: Soft. She exhibits no mass. There is no tenderness.  Genitourinary: There is no lesion on the right labia. There is no lesion on the left labia. Uterus is enlarged (slightly). Uterus is not tender. Cervix exhibits no motion tenderness, no discharge and no friability. Right adnexum displays no mass, no tenderness and no fullness. Left adnexum displays no mass, no tenderness and no fullness. There is bleeding (pink-tinged) around the vagina. Vaginal discharge found.  Neurological: She is alert and oriented to person, place, and time.  Skin: Skin is warm and dry.  Psychiatric: Her speech is normal and behavior is normal. Her mood appears anxious. Her affect is labile. She expresses no homicidal and no suicidal ideation.       Tearful    ED Course  Procedures (including critical care time)  Labs Reviewed  URINALYSIS, WITH MICROSCOPIC - Abnormal; Notable for the following:    Specific Gravity, Urine <1.005 (*)    Hgb urine dipstick TRACE (*)    Leukocytes, UA TRACE (*)    Bacteria, UA FEW (*)    Squamous Epithelial / LPF FEW (*)    All other components within normal limits  HCG, QUANTITATIVE, PREGNANCY - Abnormal; Notable for the following:    hCG, Beta Chain, Quant,  S 15561 (*)    All other components within normal limits  WET PREP, GENITAL - Abnormal; Notable for the following:    Trich, Wet Prep FEW (*)    Clue Cells Wet Prep HPF POC MODERATE (*)    WBC, Wet Prep HPF POC MANY (*) MANY BACTERIA SEEN   All other components within normal limits  POCT PREGNANCY, URINE - Abnormal; Notable for the following:    Preg Test, Ur POSITIVE (*)    All other components within normal limits  POCT PREGNANCY, URINE - Abnormal; Notable for the following:    Preg Test, Ur POSITIVE (*)    All other components within normal limits  POCT URINE PREGNANCY  ABO/RH  GC/CHLAMYDIA  PROBE AMP, GENITAL  CBC  LAB REPORT - SCANNED   [place]: 8 week IUFD BP's 120-150's/70-90's after Labetalol 100 mg and Lopressor 25 mg  1. Trichomonal vaginitis   2. Missed abortion   3. Hypertension     MDM  1. Offered expectant management vs cytotec vs D&C per consult w/ Dr. Shawnie Pons. After lengthy discussion, pt would like to return to MAU 10/19/11 for cytotec placement due to not thinking that she she place it properly and not wanting to have it placed today in MAU prior to a long bus ride.  2. Bleeding precautions 3. Support given 4. Lopressor 50 mg PO QD per Dr. Shawnie Pons. 5. F/U at [facility] for HTN   [place], Koleen Nimrod 10/16/2011

## 2011-10-16 NOTE — ED Notes (Signed)
Brought to room #6 from lobby.

## 2011-10-18 ENCOUNTER — Inpatient Hospital Stay (HOSPITAL_COMMUNITY)
Admission: AD | Admit: 2011-10-18 | Discharge: 2011-10-18 | Disposition: A | Discharge status: Home / Self Care | Payer: Self-pay | Source: Ambulatory Visit | Attending: Family Medicine | Admitting: Family Medicine

## 2011-10-18 ENCOUNTER — Encounter (HOSPITAL_COMMUNITY): Payer: Self-pay | Admitting: *Deleted

## 2011-10-18 DIAGNOSIS — O021 Missed abortion: Secondary | ICD-10-CM

## 2011-10-18 DIAGNOSIS — IMO0002 Reserved for concepts with insufficient information to code with codable children: Secondary | ICD-10-CM | POA: Insufficient documentation

## 2011-10-18 DIAGNOSIS — I10 Essential (primary) hypertension: Secondary | ICD-10-CM | POA: Insufficient documentation

## 2011-10-18 LAB — GC/CHLAMYDIA PROBE AMP, GENITAL
Chlamydia, DNA Probe: NEGATIVE
GC Probe Amp, Genital: NEGATIVE

## 2011-10-18 LAB — POCT PREGNANCY, URINE
Preg Test, Ur: POSITIVE — AB
Preg Test, Ur: POSITIVE — AB

## 2011-10-18 MED ORDER — MISOPROSTOL 200 MCG PO TABS: ORAL_TABLET | ORAL | Status: DC

## 2011-10-18 MED ORDER — PROMETHAZINE HCL 25 MG PO TABS: 25.0000 mg | ORAL_TABLET | Freq: Four times a day (QID) | ORAL | Status: AC | PRN

## 2011-10-18 MED ORDER — HYDROCODONE-ACETAMINOPHEN 5-500 MG PO TABS: 1.0000 | ORAL_TABLET | Freq: Four times a day (QID) | ORAL | Status: AC | PRN

## 2011-10-18 NOTE — ED Provider Notes (Signed)
History   Pt presents today for follow up of fetal demise. She was originally seen on 10/16/11 for the same but for some reason was unable to do cytotec at that time. She states she thought about it over the weekend and she now wants to take cytotec. She denies abd pain, vag dc, bleeding, or any other sx at this time.  No chief complaint on file.  HPI  OB History    Grav Para Term Preterm Abortions TAB SAB Ect Mult Living   9 7 7  1  1   6       Past Medical History  Diagnosis Date  . Hypertension   . Panic attacks     " a long time ago"  . Molar pregnancy   . Stillbirth   . Trichomonas     Past Surgical History  Procedure Date  . Cesarean section     History reviewed. No pertinent family history.  History  Substance Use Topics  . Smoking status: Never Smoker   . Smokeless tobacco: Never Used  . Alcohol Use: No    Allergies:  Allergies  Allergen Reactions  . Penicillins Other (See Comments)    Swelling and shortness of breath    Prescriptions prior to admission  Medication Sig Dispense Refill  . ibuprofen (ADVIL,MOTRIN) 600 MG tablet Take 1 tablet (600 mg total) by mouth every 6 (six) hours as needed for pain.  30 tablet  1  . metoprolol (LOPRESSOR) 50 MG tablet Take 1 tablet (50 mg total) by mouth 2 (two) times daily.  30 tablet  1  . promethazine (PHENERGAN) 25 MG tablet Take 1 tablet (25 mg total) by mouth every 6 (six) hours as needed for nausea.  30 tablet  0    Review of Systems  Constitutional: Negative for fever and chills.  Eyes: Negative for blurred vision and double vision.  Respiratory: Negative for cough, hemoptysis, sputum production, shortness of breath and wheezing.   Cardiovascular: Negative for chest pain and palpitations.  Gastrointestinal: Negative for nausea, vomiting, abdominal pain, diarrhea and constipation.  Genitourinary: Negative for dysuria, urgency, frequency and hematuria.  Neurological: Negative for dizziness and headaches.    Psychiatric/Behavioral: Negative for depression and suicidal ideas.   Physical Exam   Blood pressure 151/110, pulse 66, temperature 97 F (36.1 C), temperature source Oral, resp. rate 18, last menstrual period 08/11/2011.  Physical Exam  Nursing note and vitals reviewed. Constitutional: She is oriented to person, place, and time. She appears well-developed and well-nourished. No distress.  HENT:  Head: Normocephalic and atraumatic.  Eyes: EOM are normal. Pupils are equal, round, and reactive to light.  GI: Soft. She exhibits no distension and no mass. There is no tenderness. There is no rebound and no guarding.  Neurological: She is alert and oriented to person, place, and time.  Skin: Skin is warm and dry. She is not diaphoretic.  Psychiatric: She has a normal mood and affect. Her behavior is normal. Judgment and thought content normal.    MAU Course  Procedures  Results for orders placed during the hospital encounter of 10/16/11 (from the past 72 hour(s))  URINALYSIS, WITH MICROSCOPIC     Status: Abnormal   Collection Time   10/16/11  7:30 AM      Component Value Range Comment   Color, Urine YELLOW  YELLOW     APPearance CLEAR  CLEAR     Specific Gravity, Urine <1.005 (*) 1.005 - 1.030     pH  6.0  5.0 - 8.0     Glucose, UA NEGATIVE  NEGATIVE (mg/dL)    Hgb urine dipstick TRACE (*) NEGATIVE     Bilirubin Urine NEGATIVE  NEGATIVE     Ketones, ur NEGATIVE  NEGATIVE (mg/dL)    Protein, ur NEGATIVE  NEGATIVE (mg/dL)    Urobilinogen, UA 0.2  0.0 - 1.0 (mg/dL)    Nitrite NEGATIVE  NEGATIVE     Leukocytes, UA TRACE (*) NEGATIVE     WBC, UA 0-2  <3 (WBC/hpf)    RBC / HPF 0-2  <3 (RBC/hpf)    Bacteria, UA FEW (*) RARE     Squamous Epithelial / LPF FEW (*) RARE    POCT PREGNANCY, URINE     Status: Abnormal   Collection Time   10/16/11  7:42 AM      Component Value Range Comment   Preg Test, Ur POSITIVE (*) NEGATIVE    POCT PREGNANCY, URINE     Status: Abnormal   Collection Time    10/16/11  8:16 AM      Component Value Range Comment   Preg Test, Ur POSITIVE (*) NEGATIVE    POCT URINE PREGNANCY     Status: Normal   Collection Time   10/16/11  8:20 AM      Component Value Range Comment   Preg Test, Ur Positive     ABO/RH     Status: Normal   Collection Time   10/16/11  8:31 AM      Component Value Range Comment   ABO/RH(D) B POS     HCG, QUANTITATIVE, PREGNANCY     Status: Abnormal   Collection Time   10/16/11  8:31 AM      Component Value Range Comment   hCG, Beta Chain, Quant, S 15561 (*) <5 (mIU/mL)   CBC     Status: Normal   Collection Time   10/16/11  8:31 AM      Component Value Range Comment   WBC 8.3  4.0 - 10.5 (K/uL)    RBC 4.44  3.87 - 5.11 (MIL/uL)    Hemoglobin 12.3  12.0 - 15.0 (g/dL)    HCT 40.9  81.1 - 91.4 (%)    MCV 81.3  78.0 - 100.0 (fL)    MCH 27.7  26.0 - 34.0 (pg)    MCHC 34.1  30.0 - 36.0 (g/dL)    RDW 78.2  95.6 - 21.3 (%)    Platelets 255  150 - 400 (K/uL)   WET PREP, GENITAL     Status: Abnormal   Collection Time   10/16/11  8:40 AM      Component Value Range Comment   Yeast Wet Prep HPF POC NONE SEEN  NONE SEEN     Trich, Wet Prep FEW (*) NONE SEEN     Clue Cells Wet Prep HPF POC MODERATE (*) NONE SEEN     WBC, Wet Prep HPF POC MANY (*) NONE SEEN  MANY BACTERIA SEEN    [place] on 10/16/11 demonstrated a fetal demise with CRL consistent with 8.0wk fetus. Assessment and Plan  Fetal demise: discussed with pt at length. Discussed tx options including cytotec vs D&C vs expectant management. Pt desires cytotec. Will give Rx for cytotec, lortab, and phenergan. She will f/u in the GYN clinic in 2 wks. Discussed diet, activity, risks, and precautions.  Trich: pt was tx at last visit. Discussed safe sex practices.  HTN: pt has not yet taken her BP meds today.  She states she will take this ASAP. Discussed diet, activity, risks, and precautions.  Clinton Gallant. Yer Olivencia III, DrHSc, MPAS, PA-C  10/18/2011, 9:00 AM   Henrietta Hoover, PA 10/18/11  0906  Henrietta Hoover, PA 10/18/11 1610  Henrietta Hoover, PA 10/18/11 [phone]

## 2011-10-18 NOTE — Progress Notes (Signed)
Patient diagnosed with early IUFD. Patient anted to leave on 2/2 and not do cytotec at that time. Was advised to F/U today for Cytotec. Patient ready for Cytotec, but not sure if she wants Rx to do at home or have placed here. Patient has hx chronic HTN and was not on meds. Was given a dose of Labetalol and Lopressor 10/16/2011. Given Rx @ discharge, but has not picked up med. States will go and pick up after she leaves here. Discussed with patient importance of BP control.

## 2011-10-18 NOTE — ED Provider Notes (Signed)
Chart reviewed and agree with management and plan.  

## 2011-10-19 NOTE — ED Provider Notes (Signed)
Chart reviewed and agree with management and plan.  

## 2011-11-11 ENCOUNTER — Ambulatory Visit: Payer: Self-pay | Admitting: Obstetrics and Gynecology

## 2011-11-18 ENCOUNTER — Ambulatory Visit: Payer: Self-pay | Admitting: Advanced Practice Midwife

## 2012-06-16 ENCOUNTER — Inpatient Hospital Stay (HOSPITAL_COMMUNITY): Payer: Medicaid Other

## 2012-06-16 ENCOUNTER — Inpatient Hospital Stay (HOSPITAL_COMMUNITY)
Admission: AD | Admit: 2012-06-16 | Discharge: 2012-06-16 | Disposition: A | Discharge status: Home / Self Care | Payer: Medicaid Other | Source: Ambulatory Visit | Attending: Obstetrics & Gynecology | Admitting: Obstetrics & Gynecology

## 2012-06-16 ENCOUNTER — Encounter (HOSPITAL_COMMUNITY): Payer: Self-pay | Admitting: *Deleted

## 2012-06-16 DIAGNOSIS — O10919 Unspecified pre-existing hypertension complicating pregnancy, unspecified trimester: Secondary | ICD-10-CM

## 2012-06-16 DIAGNOSIS — R339 Retention of urine, unspecified: Secondary | ICD-10-CM | POA: Diagnosis present

## 2012-06-16 DIAGNOSIS — O2390 Unspecified genitourinary tract infection in pregnancy, unspecified trimester: Secondary | ICD-10-CM | POA: Insufficient documentation

## 2012-06-16 DIAGNOSIS — O10019 Pre-existing essential hypertension complicating pregnancy, unspecified trimester: Secondary | ICD-10-CM | POA: Insufficient documentation

## 2012-06-16 DIAGNOSIS — N390 Urinary tract infection, site not specified: Secondary | ICD-10-CM | POA: Diagnosis present

## 2012-06-16 DIAGNOSIS — N949 Unspecified condition associated with female genital organs and menstrual cycle: Secondary | ICD-10-CM | POA: Insufficient documentation

## 2012-06-16 LAB — URINE MICROSCOPIC-ADD ON

## 2012-06-16 LAB — URINALYSIS, ROUTINE W REFLEX MICROSCOPIC
Bilirubin Urine: NEGATIVE
Ketones, ur: NEGATIVE mg/dL
Nitrite: NEGATIVE
Specific Gravity, Urine: 1.01 (ref 1.005–1.030)
Urobilinogen, UA: 0.2 mg/dL (ref 0.0–1.0)

## 2012-06-16 MED ORDER — METHYLDOPA 500 MG PO TABS
500.0000 mg | ORAL_TABLET | ORAL | Status: AC
  Administered 2012-06-16: 500 mg via ORAL
  Filled 2012-06-16: qty 1

## 2012-06-16 MED ORDER — NITROFURANTOIN MONOHYD MACRO 100 MG PO CAPS: 100.0000 mg | ORAL_CAPSULE | Freq: Two times a day (BID) | ORAL | Status: DC

## 2012-06-16 MED ORDER — METHYLDOPA 250 MG PO TABS: 250.0000 mg | ORAL_TABLET | Freq: Two times a day (BID) | ORAL | Status: DC

## 2012-06-16 NOTE — MAU Note (Signed)
Pt states she has been unable to urinate for the last 24 hours, denies bleeding. LMP07/15/2013

## 2012-06-16 NOTE — MAU Provider Note (Signed)
Chief Complaint: No chief complaint on file.   First Provider Initiated Contact with Patient 06/16/12 0321     SUBJECTIVE HPI: Eileen [name] is a 36 y.o. [id number] at [redacted]w[redacted]d by LMP who presents to maternity admissions reporting urinary retention and pelvic pain x24 hours.  She has not yet had care in this pregnancy.  She reports positive pregnancy test in August with LMP July 15.  This pregnancy is unplanned and she is unsure about continuing the pregnancy.  She denies vaginal bleeding, vaginal itching/burning, h/a, dizziness, n/v, or fever/chills.    Pt pain is completely relieved after I/O cath of bladder.  Past Medical History  Diagnosis Date  . Hypertension   . Panic attacks     " a long time ago"  . Molar pregnancy   . Stillbirth   . Trichomonas    Past Surgical History  Procedure Date  . Cesarean section    History   Social History  . Marital Status: Married    Spouse Name: N/A    Number of Children: N/A  . Years of Education: N/A   Occupational History  . Not on file.   Social History Main Topics  . Smoking status: Never Smoker   . Smokeless tobacco: Never Used  . Alcohol Use: No  . Drug Use: No  . Sexually Active: Yes     last intercourse 3-4 months ago   Other Topics Concern  . Not on file   Social History Narrative  . No narrative on file   No current facility-administered medications on file prior to encounter.   Current Outpatient Prescriptions on File Prior to Encounter  Medication Sig Dispense Refill  . metoprolol (LOPRESSOR) 50 MG tablet Take 1 tablet (50 mg total) by mouth 2 (two) times daily.  30 tablet  1  . misoprostol (CYTOTEC) 200 MCG tablet Take all four tabs by mouth as a single dose.  4 tablet  0   Allergies  Allergen Reactions  . Penicillins Other (See Comments)    Swelling and shortness of breath    ROS: Pertinent items in HPI  OBJECTIVE Blood pressure 159/112, pulse 112, temperature 98.6 F (37 C), temperature source Oral, resp.  rate 20, height 5\' 5"  (1.651 m), weight 75.297 kg (166 lb), last menstrual period 03/27/2012, SpO2 99.00%, unknown if currently breastfeeding. GENERAL: Well-developed, well-nourished female in no acute distress.  HEENT: Normocephalic HEART: normal rate RESP: normal effort ABDOMEN: Soft, non-tender EXTREMITIES: Nontender, no edema NEURO: Alert and oriented  Bimanual exam: Cervix 0/long/high, firm, posterior, neg CMT, uterus nontender, ~11 week size, adnexa without tenderness, enlargement, or mass  I/O catheter with 850 ml yellow urine emptied  Unable to obtain FHT via doppler  LAB RESULTS Results for orders placed during the hospital encounter of 06/16/12 (from the past 24 hour(s))  URINALYSIS, ROUTINE W REFLEX MICROSCOPIC     Status: Abnormal   Collection Time   06/16/12  2:46 AM      Component Value Range   Color, Urine YELLOW  YELLOW   APPearance TURBID (*) CLEAR   Specific Gravity, Urine 1.010  1.005 - 1.030   pH 7.0  5.0 - 8.0   Glucose, UA NEGATIVE  NEGATIVE mg/dL   Hgb urine dipstick SMALL (*) NEGATIVE   Bilirubin Urine NEGATIVE  NEGATIVE   Ketones, ur NEGATIVE  NEGATIVE mg/dL   Protein, ur NEGATIVE  NEGATIVE mg/dL   Urobilinogen, UA 0.2  0.0 - 1.0 mg/dL   Nitrite NEGATIVE  NEGATIVE  Leukocytes, UA LARGE (*) NEGATIVE  URINE MICROSCOPIC-ADD ON     Status: Abnormal   Collection Time   06/16/12  2:46 AM      Component Value Range   Squamous Epithelial / LPF FEW (*) RARE   WBC, UA 11-20  <3 WBC/hpf   RBC / HPF 0-2  <3 RBC/hpf   Bacteria, UA MANY (*) RARE    IMAGING US Ob Comp Less 14 Wks  06/16/2012  *RADIOLOGY REPORT*  Clinical Data: Negative FHTs  OBSTETRIC <14 WK ULTRASOUND  Technique:  Transabdominal ultrasound was performed for evaluation of the gestation as well as the maternal uterus and adnexal regions.  Comparison:  None.  Intrauterine gestational sac: Visualized/normal in shape. Yolk sac: Identified Embryo: Identified Cardiac Activity: Identified Heart Rate:  161 bpm  CRL:  42 mm  11 w  1 d        [place] EDC: 01/04/2013  Maternal uterus/Adnexae: No subchorionic hemorrhage.  Normal sonographic appearance to the ovaries.  No free fluid.  IMPRESSION: Single intrauterine gestation with cardiac activity documented. Estimated age of 11 weeks 1 day by crown-rump length.   Original Report Authenticated By: Waneta Martins, M.D.     ASSESSMENT 1. UTI (lower urinary tract infection)   2. Urinary retention with incomplete bladder emptying   3. Chronic hypertension in obstetric context     PLAN Pt able to urinate x2 in MAU after I/O catheter  BP retake 130/90 ~1hour after I/O catheter and Aldomet dose  Discharge home Aldomet 250 mg BID Macrobid 100 mg BID x7 days [facility] with [facility] if continuing pregnancy desired Return to MAU if urinary retention recurs    Medication List     As of 06/16/2012  4:21 AM    STOP taking these medications         metoprolol 50 MG tablet   Commonly known as: LOPRESSOR      misoprostol 200 MCG tablet   Commonly known as: CYTOTEC      TAKE these medications         methyldopa 250 MG tablet   Commonly known as: ALDOMET   Take 1 tablet (250 mg total) by mouth 2 (two) times daily.      nitrofurantoin (macrocrystal-monohydrate) 100 MG capsule   Commonly known as: MACROBID   Take 1 capsule (100 mg total) by mouth 2 (two) times daily.           Sharen Counter Certified Nurse-Midwife 06/16/2012  3:29 AM

## 2012-09-13 NOTE — L&D Delivery Note (Signed)
Agree with above note.  Eileen [name] H. 12/25/2012 9:56 PM

## 2012-09-13 NOTE — L&D Delivery Note (Deleted)
Delivery attended by me also.  Agree with note Wynelle Bourgeois CNM

## 2012-09-13 NOTE — L&D Delivery Note (Addendum)
Delivery Note At 11:16 AM a viable female was delivered via Vaginal, Spontaneous Delivery (Presentation: Left Occiput Anterior).  APGAR: 8, 9; weight .   Placenta status: Intact, Spontaneous.  Cord: 3 vessels with the following complications: None.   Anesthesia: Local  Episiotomy: None Lacerations: 1st degree;Perineal Suture Repair: 3.0 vicryl Est. Blood Loss (mL): 250  Mom to postpartum.  Baby to nursery-stable.  MERRELL, DAVID, MD Family Medicine Resident PGY-2 12/25/2012, 12:23 PM  Attended by me also. No difficulty with shoulders.  Agree with note.  Wynelle Bourgeois CNM

## 2012-09-25 ENCOUNTER — Encounter: Payer: Self-pay | Admitting: Obstetrics and Gynecology

## 2012-09-26 ENCOUNTER — Encounter: Payer: Self-pay | Admitting: Obstetrics & Gynecology

## 2012-10-18 ENCOUNTER — Other Ambulatory Visit: Payer: Self-pay | Admitting: General Practice

## 2012-10-18 ENCOUNTER — Other Ambulatory Visit: Payer: Self-pay | Admitting: Advanced Practice Midwife

## 2012-10-18 DIAGNOSIS — O10019 Pre-existing essential hypertension complicating pregnancy, unspecified trimester: Secondary | ICD-10-CM

## 2012-10-18 MED ORDER — METHYLDOPA 250 MG PO TABS: 250.0000 mg | ORAL_TABLET | Freq: Two times a day (BID) | ORAL | Status: DC

## 2012-10-18 NOTE — Telephone Encounter (Signed)
Patient called and left message stating she needs a refill on her blood pressure medication and she can't remember the name of it but thinks it starts with a "M" and thinks the dose is 225-250mg  and she really needs this medicine filled before her appt because she's out and has been having chest pain and some dizziness and she uses [facility] on [address] as her pharmacy. Called patient back and told her I received her message about needing a refill and that she has been having chest pain and dizziness. Patient said that she is feeling better and not having the chest pain currently but thinks it was stress related because of her 6 children. Strongly advised patient that ANY time she has chest pain especially with her high blood pressure and pregnancy she needs to come to the hospital to be seen. Patient verbalized understanding. Spoke with Wynelle Bourgeois and received 2 refills on her medication. Told patient and that we would send this to [facility] near [address]. Patient verbalized understanding. Reminded pt of her 2/26 appt and once again to please come to the hospital should she experience chest pain again. Patient verbalized understanding and had no further questions

## 2012-11-08 ENCOUNTER — Other Ambulatory Visit (HOSPITAL_COMMUNITY)
Admission: RE | Admit: 2012-11-08 | Discharge: 2012-11-08 | Disposition: A | Discharge status: Home / Self Care | Payer: Medicaid Other | Source: Ambulatory Visit | Attending: Advanced Practice Midwife | Admitting: Advanced Practice Midwife

## 2012-11-08 ENCOUNTER — Encounter: Payer: Self-pay | Admitting: Advanced Practice Midwife

## 2012-11-08 ENCOUNTER — Ambulatory Visit (INDEPENDENT_AMBULATORY_CARE_PROVIDER_SITE_OTHER): Payer: Medicaid Other | Admitting: Advanced Practice Midwife

## 2012-11-08 VITALS — BP 128/79 | Wt 176.3 lb

## 2012-11-08 DIAGNOSIS — Z113 Encounter for screening for infections with a predominantly sexual mode of transmission: Secondary | ICD-10-CM | POA: Insufficient documentation

## 2012-11-08 DIAGNOSIS — Z1151 Encounter for screening for human papillomavirus (HPV): Secondary | ICD-10-CM | POA: Insufficient documentation

## 2012-11-08 DIAGNOSIS — O10913 Unspecified pre-existing hypertension complicating pregnancy, third trimester: Secondary | ICD-10-CM

## 2012-11-08 DIAGNOSIS — Z01419 Encounter for gynecological examination (general) (routine) without abnormal findings: Secondary | ICD-10-CM | POA: Insufficient documentation

## 2012-11-08 DIAGNOSIS — N760 Acute vaginitis: Secondary | ICD-10-CM | POA: Insufficient documentation

## 2012-11-08 DIAGNOSIS — Z3493 Encounter for supervision of normal pregnancy, unspecified, third trimester: Secondary | ICD-10-CM

## 2012-11-08 DIAGNOSIS — O10013 Pre-existing essential hypertension complicating pregnancy, third trimester: Secondary | ICD-10-CM

## 2012-11-08 DIAGNOSIS — Z3480 Encounter for supervision of other normal pregnancy, unspecified trimester: Secondary | ICD-10-CM

## 2012-11-08 DIAGNOSIS — O10019 Pre-existing essential hypertension complicating pregnancy, unspecified trimester: Secondary | ICD-10-CM

## 2012-11-08 LAB — POCT URINALYSIS DIP (DEVICE)
Bilirubin Urine: NEGATIVE
Glucose, UA: NEGATIVE mg/dL
Hgb urine dipstick: NEGATIVE
Ketones, ur: NEGATIVE mg/dL
Nitrite: NEGATIVE
pH: 6.5 (ref 5.0–8.0)

## 2012-11-08 NOTE — Progress Notes (Signed)
Pulse: 102 Wants BTL

## 2012-11-08 NOTE — Progress Notes (Signed)
.  I have seen the patient with the resident/student and agree with the above.  Tawnya Crook  I have reviewed the NST, Cat I

## 2012-11-08 NOTE — Progress Notes (Signed)
S: Here for new OB appointment. Only visit so far was at MAU at 22 weeks. Feeling well except for occasional cramping-taking Tylenol. Too late for blood genetic screening, declines amnio. Wants BTL O: See flowsheet Breast: soft, nontender, no masses Vagina: pink, no lesions, no discharge External: no lesions Cervix:closed/long/hi and posterior, no CMT Uterus: 31cm fundal height A: supervision of other normal pregnancy Late entry to [facility] multip Plans VBAC P: Pap + new ob labs +1 hour gtt done today

## 2012-11-09 LAB — OBSTETRIC PANEL
Antibody Screen: NEGATIVE
HCT: 32.4 % — ABNORMAL LOW (ref 36.0–46.0)
Hemoglobin: 11.3 g/dL — ABNORMAL LOW (ref 12.0–15.0)
Hepatitis B Surface Ag: NEGATIVE
Lymphs Abs: 1.4 10*3/uL (ref 0.7–4.0)
Monocytes Absolute: 0.3 10*3/uL (ref 0.1–1.0)
Monocytes Relative: 4 % (ref 3–12)
Neutro Abs: 6.7 10*3/uL (ref 1.7–7.7)
Neutrophils Relative %: 78 % — ABNORMAL HIGH (ref 43–77)
RBC: 4.08 MIL/uL (ref 3.87–5.11)
Rh Type: POSITIVE
Rubella: 1.29 Index — ABNORMAL HIGH (ref <0.90)

## 2012-11-10 LAB — CULTURE, OB URINE

## 2012-11-13 ENCOUNTER — Ambulatory Visit (HOSPITAL_COMMUNITY): Payer: Medicaid Other

## 2012-11-13 ENCOUNTER — Ambulatory Visit (INDEPENDENT_AMBULATORY_CARE_PROVIDER_SITE_OTHER): Payer: Medicaid Other | Admitting: *Deleted

## 2012-11-13 VITALS — BP 116/79 | Wt 182.0 lb

## 2012-11-13 DIAGNOSIS — O10013 Pre-existing essential hypertension complicating pregnancy, third trimester: Secondary | ICD-10-CM

## 2012-11-13 DIAGNOSIS — O0930 Supervision of pregnancy with insufficient antenatal care, unspecified trimester: Secondary | ICD-10-CM

## 2012-11-13 DIAGNOSIS — O10019 Pre-existing essential hypertension complicating pregnancy, unspecified trimester: Secondary | ICD-10-CM

## 2012-11-13 DIAGNOSIS — O0933 Supervision of pregnancy with insufficient antenatal care, third trimester: Secondary | ICD-10-CM

## 2012-11-13 NOTE — Progress Notes (Signed)
P = 91  [place] for anatomy, size/dates on 11/16/12 @ 1030

## 2012-11-13 NOTE — Progress Notes (Signed)
NST reviewed and reactive.  

## 2012-11-16 ENCOUNTER — Encounter: Payer: Self-pay | Admitting: Obstetrics & Gynecology

## 2012-11-16 ENCOUNTER — Other Ambulatory Visit: Payer: Self-pay | Admitting: Obstetrics & Gynecology

## 2012-11-16 ENCOUNTER — Encounter: Payer: Self-pay | Admitting: *Deleted

## 2012-11-16 ENCOUNTER — Ambulatory Visit (INDEPENDENT_AMBULATORY_CARE_PROVIDER_SITE_OTHER): Payer: Medicaid Other | Admitting: Obstetrics & Gynecology

## 2012-11-16 ENCOUNTER — Ambulatory Visit (HOSPITAL_COMMUNITY)
Admission: RE | Admit: 2012-11-16 | Discharge: 2012-11-16 | Disposition: A | Discharge status: Home / Self Care | Payer: Medicaid Other | Source: Ambulatory Visit | Attending: Advanced Practice Midwife | Admitting: Advanced Practice Midwife

## 2012-11-16 VITALS — BP 125/85 | Temp 97.0°F | Wt 180.3 lb

## 2012-11-16 DIAGNOSIS — O0930 Supervision of pregnancy with insufficient antenatal care, unspecified trimester: Secondary | ICD-10-CM

## 2012-11-16 DIAGNOSIS — O09299 Supervision of pregnancy with other poor reproductive or obstetric history, unspecified trimester: Secondary | ICD-10-CM | POA: Insufficient documentation

## 2012-11-16 DIAGNOSIS — Z8751 Personal history of pre-term labor: Secondary | ICD-10-CM | POA: Insufficient documentation

## 2012-11-16 DIAGNOSIS — Z3493 Encounter for supervision of normal pregnancy, unspecified, third trimester: Secondary | ICD-10-CM

## 2012-11-16 DIAGNOSIS — O0993 Supervision of high risk pregnancy, unspecified, third trimester: Secondary | ICD-10-CM

## 2012-11-16 DIAGNOSIS — O10913 Unspecified pre-existing hypertension complicating pregnancy, third trimester: Secondary | ICD-10-CM

## 2012-11-16 DIAGNOSIS — O0933 Supervision of pregnancy with insufficient antenatal care, third trimester: Secondary | ICD-10-CM

## 2012-11-16 DIAGNOSIS — O98319 Other infections with a predominantly sexual mode of transmission complicating pregnancy, unspecified trimester: Secondary | ICD-10-CM

## 2012-11-16 DIAGNOSIS — O10019 Pre-existing essential hypertension complicating pregnancy, unspecified trimester: Secondary | ICD-10-CM

## 2012-11-16 DIAGNOSIS — A749 Chlamydial infection, unspecified: Secondary | ICD-10-CM

## 2012-11-16 DIAGNOSIS — O10013 Pre-existing essential hypertension complicating pregnancy, third trimester: Secondary | ICD-10-CM

## 2012-11-16 LAB — POCT URINALYSIS DIP (DEVICE)
Bilirubin Urine: NEGATIVE
Ketones, ur: NEGATIVE mg/dL
Nitrite: NEGATIVE

## 2012-11-16 MED ORDER — AZITHROMYCIN 1 G PO PACK
1.0000 g | PACK | Freq: Once | ORAL | Status: AC
  Administered 2012-11-16: 1 g via ORAL

## 2012-11-16 NOTE — Progress Notes (Signed)
BTL consent signed VBAC consent signed

## 2012-11-16 NOTE — Progress Notes (Signed)
Need baseline 24 hour urine, cmp.  Treated for chlamydia today.  Anatomy today.  Wants to VBAC and then pp btl.  BP well controlled today.  History IUFD 2002.  Pt will be in 2x week fetal testing until delivery.  BTL papers today.  Needs VBAC consent.  Failed one hour; needs 3 hour

## 2012-11-16 NOTE — Progress Notes (Signed)
Pt informed of abnormal 1hr GTT- will have 3hr GTT on 3/10; also informed of +chlamydia- will receive Tx @ clinic today.  Pt has [place] today @ 1030

## 2012-11-17 DIAGNOSIS — O0993 Supervision of high risk pregnancy, unspecified, third trimester: Secondary | ICD-10-CM | POA: Insufficient documentation

## 2012-11-20 ENCOUNTER — Other Ambulatory Visit: Payer: Medicaid Other

## 2012-11-22 ENCOUNTER — Encounter: Payer: Medicaid Other | Admitting: Advanced Practice Midwife

## 2012-11-23 ENCOUNTER — Telehealth: Payer: Self-pay | Admitting: *Deleted

## 2012-11-23 ENCOUNTER — Other Ambulatory Visit: Payer: Medicaid Other

## 2012-11-23 NOTE — Telephone Encounter (Signed)
Called patient and left her a voicemail to call us back.  

## 2012-11-23 NOTE — Telephone Encounter (Signed)
Message copied by Mannie Stabile on Thu Nov 23, 2012  4:16 PM ------      Message from: Lesly Dukes      Created: Thu Nov 23, 2012  2:32 PM       Pt was supposed to complete a 24 hour urine.  Can you call and see when she is going to turn it in? ------

## 2012-11-27 NOTE — Telephone Encounter (Addendum)
Called pt at home and mobile tel #'s and was told by both people that answered that they were not Corrie Dandy and did not know her. Pt's spouse tel number has been disconnected. Pt has clinic appt on 3/18- will discuss 24hr urine w/pt @ that time.  11/29/12  0855- pt did not come to clinic yesterday for scheduled appt. Certified letter was sent to last known address requesting pt to contact our office for appt ASAP.

## 2012-11-28 ENCOUNTER — Other Ambulatory Visit: Payer: Medicaid Other

## 2012-11-28 ENCOUNTER — Encounter: Payer: Self-pay | Admitting: *Deleted

## 2012-11-29 ENCOUNTER — Encounter: Payer: Self-pay | Admitting: *Deleted

## 2012-12-06 ENCOUNTER — Ambulatory Visit (INDEPENDENT_AMBULATORY_CARE_PROVIDER_SITE_OTHER): Payer: Medicaid Other | Admitting: Advanced Practice Midwife

## 2012-12-06 VITALS — BP 130/83 | Wt 180.6 lb

## 2012-12-06 DIAGNOSIS — O10019 Pre-existing essential hypertension complicating pregnancy, unspecified trimester: Secondary | ICD-10-CM

## 2012-12-06 DIAGNOSIS — O09299 Supervision of pregnancy with other poor reproductive or obstetric history, unspecified trimester: Secondary | ICD-10-CM

## 2012-12-06 DIAGNOSIS — Z8759 Personal history of other complications of pregnancy, childbirth and the puerperium: Secondary | ICD-10-CM

## 2012-12-06 DIAGNOSIS — O10013 Pre-existing essential hypertension complicating pregnancy, third trimester: Secondary | ICD-10-CM

## 2012-12-06 DIAGNOSIS — O0993 Supervision of high risk pregnancy, unspecified, third trimester: Secondary | ICD-10-CM

## 2012-12-06 LAB — POCT URINALYSIS DIP (DEVICE)
Bilirubin Urine: NEGATIVE
Glucose, UA: NEGATIVE mg/dL
Ketones, ur: NEGATIVE mg/dL
Specific Gravity, Urine: 1.025 (ref 1.005–1.030)
Urobilinogen, UA: 1 mg/dL (ref 0.0–1.0)

## 2012-12-06 LAB — OB RESULTS CONSOLE GC/CHLAMYDIA: Gonorrhea: NEGATIVE

## 2012-12-06 NOTE — Progress Notes (Signed)
P = 94  Pt has had transportation problems- has not been seen since 3/6; still needs 24 hr urine and 3hr GTT; poor weight gain; needs to resume twice weekly fetal testing

## 2012-12-06 NOTE — Progress Notes (Signed)
Reactive NST today. Denies complaints. Will return on Friday for NST, will bring 24 hour urine collection and do 3 hour GTT at that visit. GC, GBS and Wet prep done today. Precautions rev'd.

## 2012-12-07 LAB — WET PREP, GENITAL
Clue Cells Wet Prep HPF POC: NONE SEEN
Trich, Wet Prep: NONE SEEN
Yeast Wet Prep HPF POC: NONE SEEN

## 2012-12-07 LAB — GC/CHLAMYDIA PROBE AMP, URINE
Chlamydia, Swab/Urine, PCR: NEGATIVE
GC Probe Amp, Urine: NEGATIVE

## 2012-12-08 ENCOUNTER — Other Ambulatory Visit: Payer: Medicaid Other

## 2012-12-11 ENCOUNTER — Other Ambulatory Visit: Payer: Medicaid Other

## 2012-12-14 ENCOUNTER — Encounter: Payer: Self-pay | Admitting: *Deleted

## 2012-12-14 ENCOUNTER — Other Ambulatory Visit: Payer: Self-pay | Admitting: Family Medicine

## 2012-12-14 ENCOUNTER — Ambulatory Visit (INDEPENDENT_AMBULATORY_CARE_PROVIDER_SITE_OTHER): Payer: Medicaid Other | Admitting: Obstetrics & Gynecology

## 2012-12-14 VITALS — BP 134/91 | Wt 180.1 lb

## 2012-12-14 DIAGNOSIS — O10013 Pre-existing essential hypertension complicating pregnancy, third trimester: Secondary | ICD-10-CM

## 2012-12-14 DIAGNOSIS — O10019 Pre-existing essential hypertension complicating pregnancy, unspecified trimester: Secondary | ICD-10-CM

## 2012-12-14 LAB — POCT URINALYSIS DIP (DEVICE)
Bilirubin Urine: NEGATIVE
Nitrite: NEGATIVE
pH: 7 (ref 5.0–8.0)

## 2012-12-14 NOTE — Progress Notes (Signed)
P = 87    Pt denies H/A or visual disturbances.  Pt has not had transportation to clinic appts since 3/26- 3hr GTT and 24 hr urine still not done.  Follow up US for growth scheduled on 4/7.   CBG- 1hr PP= 84

## 2012-12-14 NOTE — Progress Notes (Signed)
One hour post prandial is 84.  Pt did not come for 3 hr GTT or turn in 24 hour urine yet due to transportation issues.  NST reactive today.  Pt is for VBAC and BTL.  Both consents are signed and on chart.  Will continue 2x weekly testing.  Will get fasting CBG next visit.  If pt can come in we will do 3 hr GTT, but pt should follow GDM diet in the meantime.  Induce at 39 weeks (has good dates LMP c/w 11 week [place]).  Foley bulb if unfavorable (history of prior c/s)

## 2012-12-18 ENCOUNTER — Ambulatory Visit (HOSPITAL_COMMUNITY)
Admission: RE | Admit: 2012-12-18 | Discharge: 2012-12-18 | Disposition: A | Discharge status: Home / Self Care | Payer: Medicaid Other | Source: Ambulatory Visit | Attending: Obstetrics & Gynecology | Admitting: Obstetrics & Gynecology

## 2012-12-18 ENCOUNTER — Ambulatory Visit (INDEPENDENT_AMBULATORY_CARE_PROVIDER_SITE_OTHER): Payer: Medicaid Other | Admitting: *Deleted

## 2012-12-18 VITALS — BP 123/80

## 2012-12-18 DIAGNOSIS — Z8751 Personal history of pre-term labor: Secondary | ICD-10-CM | POA: Insufficient documentation

## 2012-12-18 DIAGNOSIS — O09299 Supervision of pregnancy with other poor reproductive or obstetric history, unspecified trimester: Secondary | ICD-10-CM | POA: Insufficient documentation

## 2012-12-18 DIAGNOSIS — O0993 Supervision of high risk pregnancy, unspecified, third trimester: Secondary | ICD-10-CM

## 2012-12-18 DIAGNOSIS — Z8759 Personal history of other complications of pregnancy, childbirth and the puerperium: Secondary | ICD-10-CM

## 2012-12-18 DIAGNOSIS — O10019 Pre-existing essential hypertension complicating pregnancy, unspecified trimester: Secondary | ICD-10-CM

## 2012-12-18 DIAGNOSIS — O10013 Pre-existing essential hypertension complicating pregnancy, third trimester: Secondary | ICD-10-CM

## 2012-12-18 LAB — COMPREHENSIVE METABOLIC PANEL
ALT: <8 U/L (ref 0–35)
AST: 13 U/L (ref 0–37)
CO2: 23 mEq/L (ref 19–32)
Chloride: 107 mEq/L (ref 96–112)
Creat: 0.78 mg/dL (ref 0.50–1.10)
Sodium: 137 mEq/L (ref 135–145)
Total Bilirubin: 0.9 mg/dL (ref 0.3–1.2)
Total Protein: 6 g/dL (ref 6.0–8.3)

## 2012-12-18 NOTE — Progress Notes (Signed)
NST reviewed and reactive.  Sael Furches L. Harraway-Smith, M.D., FACOG    

## 2012-12-18 NOTE — Progress Notes (Signed)
P = 89  Growth [place] today.  Brought 24 hr urine, PIH labs drawn

## 2012-12-19 ENCOUNTER — Telehealth (HOSPITAL_COMMUNITY): Payer: Self-pay | Admitting: *Deleted

## 2012-12-19 ENCOUNTER — Encounter (HOSPITAL_COMMUNITY): Payer: Self-pay | Admitting: *Deleted

## 2012-12-19 LAB — CREATININE CLEARANCE, URINE, 24 HOUR
Creatinine, 24H Ur: 1029 mg/d (ref 700–1800)
Creatinine, Urine: 121.1 mg/dL

## 2012-12-19 LAB — PROTEIN, URINE, 24 HOUR: Protein, Urine: 9 mg/dL

## 2012-12-19 NOTE — Telephone Encounter (Signed)
Preadmission screen  

## 2012-12-21 ENCOUNTER — Encounter: Payer: Self-pay | Admitting: Obstetrics & Gynecology

## 2012-12-21 ENCOUNTER — Other Ambulatory Visit: Payer: Medicaid Other

## 2012-12-25 ENCOUNTER — Encounter (HOSPITAL_COMMUNITY): Payer: Self-pay

## 2012-12-25 ENCOUNTER — Inpatient Hospital Stay (HOSPITAL_COMMUNITY)
Admission: RE | Admit: 2012-12-25 | Discharge: 2012-12-26 | DRG: 774 | Disposition: A | Discharge status: Home / Self Care | Payer: Medicaid Other | Source: Ambulatory Visit | Attending: Obstetrics & Gynecology | Admitting: Obstetrics & Gynecology

## 2012-12-25 VITALS — BP 128/76 | HR 88 | Temp 99.1°F | Resp 19 | Ht 64.0 in | Wt 186.0 lb

## 2012-12-25 DIAGNOSIS — Z8759 Personal history of other complications of pregnancy, childbirth and the puerperium: Secondary | ICD-10-CM

## 2012-12-25 DIAGNOSIS — O0933 Supervision of pregnancy with insufficient antenatal care, third trimester: Secondary | ICD-10-CM

## 2012-12-25 DIAGNOSIS — O0993 Supervision of high risk pregnancy, unspecified, third trimester: Secondary | ICD-10-CM

## 2012-12-25 DIAGNOSIS — O1002 Pre-existing essential hypertension complicating childbirth: Principal | ICD-10-CM | POA: Diagnosis present

## 2012-12-25 LAB — CBC
HCT: 33.1 % — ABNORMAL LOW (ref 36.0–46.0)
MCH: 28.3 pg (ref 26.0–34.0)
MCHC: 35 g/dL (ref 30.0–36.0)
MCV: 80.7 fL (ref 78.0–100.0)
Platelets: 166 10*3/uL (ref 150–400)
RDW: 14.3 % (ref 11.5–15.5)
WBC: 8.3 10*3/uL (ref 4.0–10.5)

## 2012-12-25 LAB — GLUCOSE, CAPILLARY: Glucose-Capillary: 87 mg/dL (ref 70–99)

## 2012-12-25 MED ORDER — PRENATAL MULTIVITAMIN CH
1.0000 | ORAL_TABLET | Freq: Every day | ORAL | Status: DC
  Administered 2012-12-25 – 2012-12-26 (×2): 1 via ORAL
  Filled 2012-12-25 (×2): qty 1

## 2012-12-25 MED ORDER — LACTATED RINGERS IV SOLN
INTRAVENOUS | Status: DC
  Administered 2012-12-25: 11:00:00 via INTRAVENOUS

## 2012-12-25 MED ORDER — OXYCODONE-ACETAMINOPHEN 5-325 MG PO TABS
1.0000 | ORAL_TABLET | ORAL | Status: DC | PRN
Start: 2012-12-25 — End: 2012-12-26

## 2012-12-25 MED ORDER — OXYTOCIN 40 UNITS IN LACTATED RINGERS INFUSION - SIMPLE MED: 62.5000 mL/h | INTRAVENOUS | Status: DC

## 2012-12-25 MED ORDER — METHYLDOPA 250 MG PO TABS
250.0000 mg | ORAL_TABLET | Freq: Two times a day (BID) | ORAL | Status: DC
  Administered 2012-12-25 – 2012-12-26 (×2): 250 mg via ORAL
  Filled 2012-12-25 (×5): qty 1

## 2012-12-25 MED ORDER — IBUPROFEN 600 MG PO TABS
600.0000 mg | ORAL_TABLET | Freq: Four times a day (QID) | ORAL | Status: DC | PRN
  Filled 2012-12-25: qty 1

## 2012-12-25 MED ORDER — CITRIC ACID-SODIUM CITRATE 334-500 MG/5ML PO SOLN: 30.0000 mL | ORAL | Status: DC | PRN

## 2012-12-25 MED ORDER — TETANUS-DIPHTH-ACELL PERTUSSIS 5-2.5-18.5 LF-MCG/0.5 IM SUSP: 0.5000 mL | Freq: Once | INTRAMUSCULAR | Status: DC

## 2012-12-25 MED ORDER — SIMETHICONE 80 MG PO CHEW: 80.0000 mg | CHEWABLE_TABLET | ORAL | Status: DC | PRN

## 2012-12-25 MED ORDER — ZOLPIDEM TARTRATE 5 MG PO TABS: 5.0000 mg | ORAL_TABLET | Freq: Every evening | ORAL | Status: DC | PRN

## 2012-12-25 MED ORDER — LACTATED RINGERS IV SOLN: 500.0000 mL | INTRAVENOUS | Status: DC | PRN

## 2012-12-25 MED ORDER — BENZOCAINE-MENTHOL 20-0.5 % EX AERO
1.0000 "application " | INHALATION_SPRAY | CUTANEOUS | Status: DC | PRN
  Filled 2012-12-25: qty 56

## 2012-12-25 MED ORDER — ONDANSETRON HCL 4 MG/2ML IJ SOLN: 4.0000 mg | Freq: Four times a day (QID) | INTRAMUSCULAR | Status: DC | PRN

## 2012-12-25 MED ORDER — OXYTOCIN 40 UNITS IN LACTATED RINGERS INFUSION - SIMPLE MED
1.0000 m[IU]/min | INTRAVENOUS | Status: DC
  Administered 2012-12-25: 2 m[IU]/min via INTRAVENOUS
  Filled 2012-12-25: qty 1000

## 2012-12-25 MED ORDER — ONDANSETRON HCL 4 MG PO TABS: 4.0000 mg | ORAL_TABLET | ORAL | Status: DC | PRN

## 2012-12-25 MED ORDER — OXYTOCIN BOLUS FROM INFUSION
500.0000 mL | INTRAVENOUS | Status: DC
  Administered 2012-12-25: 500 mL via INTRAVENOUS

## 2012-12-25 MED ORDER — FENTANYL CITRATE 0.05 MG/ML IJ SOLN
100.0000 ug | INTRAMUSCULAR | Status: DC | PRN
  Administered 2012-12-25: 100 ug via INTRAVENOUS
  Filled 2012-12-25 (×3): qty 2

## 2012-12-25 MED ORDER — TERBUTALINE SULFATE 1 MG/ML IJ SOLN: 0.2500 mg | Freq: Once | INTRAMUSCULAR | Status: DC | PRN

## 2012-12-25 MED ORDER — FLEET ENEMA 7-19 GM/118ML RE ENEM: 1.0000 | ENEMA | RECTAL | Status: DC | PRN

## 2012-12-25 MED ORDER — ACETAMINOPHEN 325 MG PO TABS: 650.0000 mg | ORAL_TABLET | ORAL | Status: DC | PRN

## 2012-12-25 MED ORDER — WITCH HAZEL-GLYCERIN EX PADS: 1.0000 "application " | MEDICATED_PAD | CUTANEOUS | Status: DC | PRN

## 2012-12-25 MED ORDER — DIBUCAINE 1 % RE OINT
1.0000 "application " | TOPICAL_OINTMENT | RECTAL | Status: DC | PRN
  Filled 2012-12-25: qty 28

## 2012-12-25 MED ORDER — LANOLIN HYDROUS EX OINT: TOPICAL_OINTMENT | CUTANEOUS | Status: DC | PRN

## 2012-12-25 MED ORDER — IBUPROFEN 600 MG PO TABS
600.0000 mg | ORAL_TABLET | Freq: Four times a day (QID) | ORAL | Status: DC
  Administered 2012-12-25 – 2012-12-26 (×2): 600 mg via ORAL
  Filled 2012-12-25 (×2): qty 1

## 2012-12-25 MED ORDER — SENNOSIDES-DOCUSATE SODIUM 8.6-50 MG PO TABS
2.0000 | ORAL_TABLET | Freq: Every day | ORAL | Status: DC
  Administered 2012-12-25: 2 via ORAL

## 2012-12-25 MED ORDER — DIPHENHYDRAMINE HCL 25 MG PO CAPS: 25.0000 mg | ORAL_CAPSULE | Freq: Four times a day (QID) | ORAL | Status: DC | PRN

## 2012-12-25 MED ORDER — LIDOCAINE HCL (PF) 1 % IJ SOLN
30.0000 mL | INTRAMUSCULAR | Status: DC | PRN
  Administered 2012-12-25: 30 mL via SUBCUTANEOUS
  Filled 2012-12-25 (×2): qty 30

## 2012-12-25 MED ORDER — ONDANSETRON HCL 4 MG/2ML IJ SOLN: 4.0000 mg | INTRAMUSCULAR | Status: DC | PRN

## 2012-12-25 MED ORDER — OXYCODONE-ACETAMINOPHEN 5-325 MG PO TABS: 1.0000 | ORAL_TABLET | ORAL | Status: DC | PRN

## 2012-12-26 ENCOUNTER — Encounter (HOSPITAL_COMMUNITY)
Admission: RE | Disposition: A | Discharge status: Home / Self Care | Payer: Self-pay | Source: Ambulatory Visit | Attending: Obstetrics & Gynecology

## 2012-12-26 DIAGNOSIS — O1002 Pre-existing essential hypertension complicating childbirth: Principal | ICD-10-CM

## 2012-12-26 LAB — CBC
HCT: 26.9 % — ABNORMAL LOW (ref 36.0–46.0)
Hemoglobin: 9.6 g/dL — ABNORMAL LOW (ref 12.0–15.0)
MCH: 28.9 pg (ref 26.0–34.0)
MCHC: 35.7 g/dL (ref 30.0–36.0)
MCV: 81 fL (ref 78.0–100.0)
RBC: 3.32 MIL/uL — ABNORMAL LOW (ref 3.87–5.11)

## 2012-12-26 SURGERY — LIGATION, FALLOPIAN TUBE, POSTPARTUM
Anesthesia: Epidural | Laterality: Bilateral

## 2012-12-26 MED ORDER — IBUPROFEN 600 MG PO TABS: 600.0000 mg | ORAL_TABLET | Freq: Four times a day (QID) | ORAL | Status: DC

## 2012-12-26 MED ORDER — FAMOTIDINE 20 MG PO TABS: 40.0000 mg | ORAL_TABLET | Freq: Once | ORAL | Status: DC

## 2012-12-26 MED ORDER — ENALAPRIL-HYDROCHLOROTHIAZIDE 10-25 MG PO TABS: 1.0000 | ORAL_TABLET | Freq: Every day | ORAL | Status: DC

## 2012-12-26 MED ORDER — FAMOTIDINE 40 MG PO TABS: 40.0000 mg | ORAL_TABLET | Freq: Once | ORAL | Status: DC

## 2012-12-26 MED ORDER — METOCLOPRAMIDE HCL 10 MG PO TABS: 10.0000 mg | ORAL_TABLET | Freq: Once | ORAL | Status: DC

## 2012-12-26 MED ORDER — LACTATED RINGERS IV SOLN: INTRAVENOUS | Status: DC

## 2012-12-26 NOTE — Progress Notes (Signed)
UR chart review completed.  

## 2012-12-26 NOTE — Progress Notes (Signed)
Post Partum Day 1 Subjective: no complaints, up ad lib, voiding, tolerating PO and + flatus  Objective: Blood pressure 117/79, pulse 73, temperature 99.1 F (37.3 C), temperature source Oral, resp. rate 19, height 5\' 4"  (1.626 m), weight 186 lb (84.369 kg), last menstrual period 03/27/2012, SpO2 98.00%, unknown if currently breastfeeding.  Physical Exam:  General: alert, cooperative and no distress Lochia: appropriate Uterine Fundus: firm Incision: n/a DVT Evaluation: No evidence of DVT seen on physical exam. Negative Homan's sign. No cords or calf tenderness.   Recent Labs  12/25/12 0715 12/26/12 0640  HGB 11.6* 9.6*  HCT 33.1* 26.9*    Assessment/Plan: Discharge home, Lactation consult and Contraception BTL 6 weeks post partum Will change BP meds.  Stop aldomet and start enalipril/HCTZ.  BP check in 3 days at the [facility] clinic.   LOS: 1 day   Eileen [name] H. 12/26/2012, 7:43 AM

## 2012-12-26 NOTE — Discharge Summary (Signed)
Obstetric Discharge Summary Reason for Admission: induction of labor for chronic HTN Prenatal Procedures: NST and ultrasound Intrapartum Procedures: spontaneous vaginal delivery Postpartum Procedures: none Complications-Operative and Postpartum: 1st degree laceration repair Hemoglobin  Date Value Range Status  12/26/2012 9.6* 12.0 - 15.0 g/dL Final     REPEATED TO VERIFY     DELTA CHECK NOTED     HCT  Date Value Range Status  12/26/2012 26.9* 36.0 - 46.0 % Final    Physical Exam:  General: alert, cooperative and no distress Lochia: appropriate Uterine Fundus: firm Incision: n/a DVT Evaluation: No evidence of DVT seen on physical exam. Negative Homan's sign. No cords or calf tenderness.  Discharge Diagnoses: Term Pregnancy-delivered  Discharge Information: Date: 12/26/2012 Activity: pelvic rest Diet: routine Medications: enalipril/HCTZ and normal pp meds Condition: improved Instructions: refer to practice specific booklet Discharge to: home   Newborn Data: Live born female  Birth Weight: 6 lb 4.9 oz (2860 g) APGAR: 8, 9  Home with mother.  Kristin Lamagna H. 12/26/2012, 7:58 AM

## 2012-12-27 ENCOUNTER — Emergency Department (HOSPITAL_COMMUNITY)
Admission: EM | Admit: 2012-12-27 | Discharge: 2012-12-27 | Disposition: A | Discharge status: Home / Self Care | Payer: Medicaid Other | Attending: Emergency Medicine | Admitting: Emergency Medicine

## 2012-12-27 ENCOUNTER — Encounter (HOSPITAL_COMMUNITY): Payer: Self-pay | Admitting: *Deleted

## 2012-12-27 DIAGNOSIS — N390 Urinary tract infection, site not specified: Secondary | ICD-10-CM

## 2012-12-27 DIAGNOSIS — O26899 Other specified pregnancy related conditions, unspecified trimester: Secondary | ICD-10-CM | POA: Insufficient documentation

## 2012-12-27 DIAGNOSIS — O2390 Unspecified genitourinary tract infection in pregnancy, unspecified trimester: Secondary | ICD-10-CM | POA: Insufficient documentation

## 2012-12-27 DIAGNOSIS — R42 Dizziness and giddiness: Secondary | ICD-10-CM | POA: Insufficient documentation

## 2012-12-27 DIAGNOSIS — O901 Disruption of perineal obstetric wound: Secondary | ICD-10-CM | POA: Insufficient documentation

## 2012-12-27 DIAGNOSIS — Z79899 Other long term (current) drug therapy: Secondary | ICD-10-CM | POA: Insufficient documentation

## 2012-12-27 DIAGNOSIS — O165 Unspecified maternal hypertension, complicating the puerperium: Secondary | ICD-10-CM | POA: Insufficient documentation

## 2012-12-27 DIAGNOSIS — Z9889 Other specified postprocedural states: Secondary | ICD-10-CM | POA: Insufficient documentation

## 2012-12-27 DIAGNOSIS — Z8659 Personal history of other mental and behavioral disorders: Secondary | ICD-10-CM | POA: Insufficient documentation

## 2012-12-27 DIAGNOSIS — Z8619 Personal history of other infectious and parasitic diseases: Secondary | ICD-10-CM | POA: Insufficient documentation

## 2012-12-27 LAB — COMPREHENSIVE METABOLIC PANEL
ALT: 8 U/L (ref 0–35)
AST: 15 U/L (ref 0–37)
Albumin: 2.3 g/dL — ABNORMAL LOW (ref 3.5–5.2)
CO2: 24 mEq/L (ref 19–32)
Calcium: 8.7 mg/dL (ref 8.4–10.5)
Creatinine, Ser: 0.91 mg/dL (ref 0.50–1.10)
Sodium: 139 mEq/L (ref 135–145)

## 2012-12-27 LAB — URINALYSIS, ROUTINE W REFLEX MICROSCOPIC
Bilirubin Urine: NEGATIVE
Glucose, UA: NEGATIVE mg/dL
Specific Gravity, Urine: 1.006 (ref 1.005–1.030)
Urobilinogen, UA: 0.2 mg/dL (ref 0.0–1.0)
pH: 7 (ref 5.0–8.0)

## 2012-12-27 LAB — CBC WITH DIFFERENTIAL/PLATELET
Basophils Absolute: 0 10*3/uL (ref 0.0–0.1)
Basophils Relative: 0 % (ref 0–1)
Eosinophils Relative: 1 % (ref 0–5)
HCT: 29.2 % — ABNORMAL LOW (ref 36.0–46.0)
Lymphocytes Relative: 19 % (ref 12–46)
MCHC: 36 g/dL (ref 30.0–36.0)
MCV: 79.3 fL (ref 78.0–100.0)
Monocytes Absolute: 0.5 10*3/uL (ref 0.1–1.0)
Neutro Abs: 6.5 10*3/uL (ref 1.7–7.7)
Platelets: 173 10*3/uL (ref 150–400)
RDW: 14.3 % (ref 11.5–15.5)
WBC: 8.7 10*3/uL (ref 4.0–10.5)

## 2012-12-27 LAB — URINE MICROSCOPIC-ADD ON

## 2012-12-27 MED ORDER — ALUM & MAG HYDROXIDE-SIMETH 200-200-20 MG/5ML PO SUSP: 30.0000 mL | Freq: Once | ORAL | Status: DC

## 2012-12-27 MED ORDER — ALBUTEROL (5 MG/ML) CONTINUOUS INHALATION SOLN
INHALATION_SOLUTION | RESPIRATORY_TRACT | Status: AC
  Filled 2012-12-27: qty 20

## 2012-12-27 MED ORDER — SODIUM CHLORIDE 0.9 % IV BOLUS (SEPSIS)
1000.0000 mL | Freq: Once | INTRAVENOUS | Status: AC
  Administered 2012-12-27: 1000 mL via INTRAVENOUS

## 2012-12-27 NOTE — ED Notes (Signed)
Given bag w/ extra peri pads, diapers. Given ginger ale and sandwhich

## 2012-12-27 NOTE — ED Provider Notes (Signed)
History     CSN: [id number]  Arrival date & time 12/27/12  4098   First MD Initiated Contact with Patient 12/27/12 [phone]      Chief Complaint  Patient presents with  . Hypertension    post pregnancy    (Consider location/radiation/quality/duration/timing/severity/associated sxs/prior treatment) HPI Comments: Patient presents to the ED with chief complaint of dizziness. She states that when she awoke this morning to use the bathroom, she noticed that she was dizzy, and had to lay back down.  She is not dizzy right now. She is one week postpartum. She states that she had some bleeding following an episiotomy, but that this did not prolong her hospital stay after delivery. She states that she was taking methyldopa prior to delivery, but recently had her medications changed to Vaseretic.  She has not started taking these yet.  She denies chest pain, SOB, nausea, vomiting, diarrhea, and constipation.  She states that she is still having some mild bleeding, but that it has not been severe.  She denies being in any pain at this time.    The history is provided by the patient. No language interpreter was used.    Past Medical History  Diagnosis Date  . Hypertension   . Panic attacks     " a long time ago"  . Molar pregnancy   . Stillbirth   . Trichomonas     Past Surgical History  Procedure Laterality Date  . Cesarean section    . Dilation and curettage of uterus      Family History  Problem Relation Age of Onset  . Hypertension Maternal Grandfather   . Alcohol abuse Neg Hx   . Arthritis Neg Hx   . Asthma Neg Hx   . Birth defects Neg Hx   . Cancer Neg Hx   . COPD Neg Hx   . Diabetes Neg Hx   . Drug abuse Neg Hx   . Early death Neg Hx   . Hearing loss Neg Hx   . Hyperlipidemia Neg Hx   . Heart disease Neg Hx   . Kidney disease Neg Hx   . Learning disabilities Neg Hx   . Mental illness Neg Hx   . Mental retardation Neg Hx   . Miscarriages / Stillbirths Neg Hx   . Stroke  Neg Hx   . Vision loss Neg Hx   . Depression Cousin     History  Substance Use Topics  . Smoking status: Never Smoker   . Smokeless tobacco: Never Used  . Alcohol Use: No    OB History   Grav Para Term Preterm Abortions TAB SAB Ect Mult Living   9 7 6 1 2  0 2 0 0 6      Review of Systems  All other systems reviewed and are negative.    Allergies  Penicillins  Home Medications   Current Outpatient Rx  Name  Route  Sig  Dispense  Refill  . acetaminophen (TYLENOL) 325 MG tablet   Oral   Take 650 mg by mouth daily as needed for pain.          . Ca Carbonate-Mag Hydroxide (ROLAIDS PO)   Oral   Take 2 tablets by mouth 2 (two) times daily as needed (heartburn).         Marland Kitchen ibuprofen (ADVIL,MOTRIN) 600 MG tablet   Oral   Take 1 tablet (600 mg total) by mouth every 6 (six) hours.   30 tablet  1   . enalapril-hydrochlorothiazide (VASERETIC) 10-25 MG per tablet   Oral   Take 1 tablet by mouth daily.   30 tablet   3   . famotidine (PEPCID) 40 MG tablet   Oral   Take 1 tablet (40 mg total) by mouth once.   30 tablet   0     BP 152/96  Pulse 91  Temp(Src) 98.4 F (36.9 C) (Oral)  Resp 16  SpO2 99%  LMP 03/27/2012  Physical Exam  Nursing note and vitals reviewed. Constitutional: She is oriented to person, place, and time. She appears well-developed and well-nourished.  HENT:  Head: Normocephalic and atraumatic.  Eyes: Conjunctivae and EOM are normal. Pupils are equal, round, and reactive to light.  Neck: Normal range of motion. Neck supple.  Cardiovascular: Normal rate and regular rhythm.  Exam reveals no gallop and no friction rub.   No murmur heard. Pulmonary/Chest: Effort normal and breath sounds normal. No respiratory distress. She has no wheezes. She has no rales. She exhibits no tenderness.  Abdominal: Soft. Bowel sounds are normal. She exhibits no distension and no mass. There is no tenderness. There is no rebound and no guarding.   Genitourinary:  Well healing vagina, with no active bleeding  Musculoskeletal: Normal range of motion. She exhibits no edema and no tenderness.  Neurological: She is alert and oriented to person, place, and time.  Skin: Skin is warm and dry.  Psychiatric: She has a normal mood and affect. Her behavior is normal. Judgment and thought content normal.    ED Course  Procedures (including critical care time)  Labs Reviewed  CBC WITH DIFFERENTIAL  COMPREHENSIVE METABOLIC PANEL   No results found.  ED ECG REPORT  I personally interpreted this EKG   Date: 12/27/2012   Rate: 94  Rhythm: normal sinus rhythm  QRS Axis: normal  Intervals: normal  ST/T Wave abnormalities: normal  Conduction Disutrbances:none  Narrative Interpretation:   Old EKG Reviewed: none available    1. UTI (lower urinary tract infection)   2. HTN    MDM  Patient with hypertension and dizziness 1 week postpartum.  Will check EKG, orthostatic vs, and labs.  Patient was hypertensive prior to delivery, and was taking methyldopa.  She was recently switched to Vaseretic.  She has not started taking this yet. Patient did have some bleeding following an episiotomy, but this has since almost entirely resolved.  Patient has been hypertensive, but she has not been taking her new BP medicines.  She is not orthostatic.  She has improving anemia.  She is not actively bleeding.  She can be discharged to home with OB/GYN tomorrow.  Discussed the patient with Dr. Lorenso Courier, who agrees with the plan.        Roxy Horseman, PA-C 12/27/12 [phone]

## 2012-12-27 NOTE — ED Notes (Signed)
Pt states that she gave birth on Monday 12/18/12. Pt states that she would have similar chills due to the fact that she lost blood during delivery. Pt states that she took her BP at home (had problems throughout pregnancy with BP) and it was 160's/101. Pt states she was suppose to start a new BP medication, but she only got it filled today (has not been taking it) and also suppose to follow up with [facility] for her BP.

## 2012-12-27 NOTE — ED Notes (Signed)
Pt states that this is her 7th child and she has had to take bp meds w/ all of them states all vag births except one. Pt still having bright red bleeding from vag area states delivered baby on Monday 4/14 and was sent home on 4/15 could not get new bp meds filled she did not have 3$. Pt states  Her children are 19,18,16,12,5,2, and new born pt is breast feeding. Pt states that she understands that she is to stand slowly and make sure she and baby are safe before she starts to walk. She states that  The kids are home for spring break this week. Told her to ask for their help.

## 2012-12-27 NOTE — ED Provider Notes (Signed)
Medical screening examination/treatment/procedure(s) were performed by non-physician practitioner and as supervising physician I was immediately available for consultation/collaboration.  Tobin Chad, MD 12/27/12 [phone]

## 2012-12-27 NOTE — ED Notes (Addendum)
Pt states that she is still having vaginal bleeding, but she knows that bleeding is normal after birth. Pt states that she starts to feel light headed and dizzy when she stands, or when her BP is elevated. Pt states that she felt better upon arrival to E.D., with EMS she took her home BP medication. Pt states her sister was going to take her to get her new BP medication today. Pt alert and oriented, able to follow commands, able to move all extremities.

## 2012-12-27 NOTE — Progress Notes (Signed)
Pt discharged before CSW could assess reason for [facility]. UDS is negative, meconium results are pending. CSW will continue to monitor results & report to CPS if necessary.

## 2012-12-28 ENCOUNTER — Ambulatory Visit: Payer: Medicaid Other

## 2012-12-28 LAB — URINE CULTURE

## 2013-01-02 NOTE — H&P (Signed)
Eileen [name] is a 37 y.o. female presenting for Induction of Labor for hypertension and history of previous C/S and still birth . Maternal Medical History:  Reason for admission: Nausea.  Fetal activity: Perceived fetal activity is normal.   Last perceived fetal movement was within the past hour.    Prenatal complications: PIH and IUGR.   No bleeding.   Prenatal Complications - Diabetes: none.    OB History   Grav Para Term Preterm Abortions TAB SAB Ect Mult Living   9 7 6 1 2  0 2 0 0 6     Past Medical History  Diagnosis Date  . Hypertension   . Panic attacks     " a long time ago"  . Molar pregnancy   . Stillbirth   . Trichomonas    Past Surgical History  Procedure Laterality Date  . Cesarean section    . Dilation and curettage of uterus     Family History: family history includes Depression in her cousin and Hypertension in her maternal grandfather.  There is no history of Alcohol abuse, and Arthritis, and Asthma, and Birth defects, and Cancer, and COPD, and Diabetes, and Drug abuse, and Early death, and Hearing loss, and Hyperlipidemia, and Heart disease, and Kidney disease, and Learning disabilities, and Mental illness, and Mental retardation, and Miscarriages / Stillbirths, and Stroke, and Vision loss, . Social History:  reports that she has never smoked. She has never used smokeless tobacco. She reports that she does not drink alcohol or use illicit drugs.   Prenatal Transfer Tool  Maternal Diabetes: No Genetic Screening: Declined Maternal Ultrasounds/Referrals: Normal Fetal Ultrasounds or other Referrals:  None Maternal Substance Abuse:  No Significant Maternal Medications:  None Significant Maternal Lab Results:  None Other Comments:  None  Review of Systems  Constitutional: Negative for fever, chills and malaise/fatigue.  Eyes: Negative for blurred vision.  Gastrointestinal: Negative for nausea, vomiting, abdominal pain, diarrhea and constipation.   Genitourinary: Negative for dysuria.  Neurological: Negative for sensory change, speech change and focal weakness.  Psychiatric/Behavioral: Negative for depression.    Dilation: 10 Effacement (%): 100;90 Station: +2 Exam by:: R Simpson RN Blood pressure 128/76, pulse 88, temperature 99.1 F (37.3 C), temperature source Oral, resp. rate 19, height 5\' 4"  (1.626 m), weight 186 lb (84.369 kg), last menstrual period 03/27/2012, SpO2 98.00%. Maternal Exam:  Uterine Assessment: Contraction frequency is rare.   Abdomen: Estimated fetal weight is 7lb.   Fetal presentation: vertex  Introitus: Normal vulva. Normal vagina.  Vagina is negative for discharge.  Ferning test: not done.  Nitrazine test: not done. Amniotic fluid character: not assessed.  Pelvis: adequate for delivery.   Cervix: Cervix evaluated by digital exam.     Fetal Exam Fetal Monitor Review: Mode: ultrasound.   Baseline rate: 140.  Variability: moderate (6-25 bpm).   Pattern: no decelerations and accelerations present.    Fetal State Assessment: Category I - tracings are normal.     Physical Exam  Constitutional: She is oriented to person, place, and time. She appears well-developed and well-nourished. No distress.  HENT:  Head: Normocephalic.  Cardiovascular: Normal rate.   Respiratory: Effort normal.  GI: Soft. She exhibits no distension and no mass. There is no tenderness. There is no rebound and no guarding.  Genitourinary: Vagina normal and uterus normal. No vaginal discharge found.  Cervix 3+cm  Musculoskeletal: Normal range of motion.  Neurological: She is alert and oriented to person, place, and time.  Skin: Skin  is warm and dry.  Psychiatric: She has a normal mood and affect.    Prenatal labs: ABO, Rh: --/--/B POS (04/14 0739) Antibody: NEG (04/14 0739) Rubella: 1.29 (02/26 1023) RPR: NON REACTIVE (04/14 0715)  HBsAg: NEGATIVE (02/26 1023)  HIV: NON REACTIVE (02/26 1023)  GBS: Negative (03/26  0000)   Assessment/Plan: A:  SIUP at [redacted]w[redacted]d       Gestational hypertension      Late prenatal care  P:  Admit to [facility]      Routine orders      Pitocin induction of labor  [facility] 01/02/2013, 11:53 PM

## 2013-01-05 ENCOUNTER — Encounter: Payer: Self-pay | Admitting: *Deleted

## 2013-01-08 NOTE — H&P (Signed)
Pt seen and examined.  Agree with above.

## 2013-01-23 ENCOUNTER — Encounter (HOSPITAL_COMMUNITY): Payer: Self-pay | Admitting: Pharmacy Technician

## 2013-01-29 ENCOUNTER — Encounter (HOSPITAL_COMMUNITY): Admission: RE | Payer: Self-pay | Source: Ambulatory Visit

## 2013-01-29 ENCOUNTER — Ambulatory Visit (HOSPITAL_COMMUNITY)
Admission: RE | Admit: 2013-01-29 | Payer: Medicaid Other | Source: Ambulatory Visit | Admitting: Obstetrics & Gynecology

## 2013-01-29 ENCOUNTER — Ambulatory Visit: Payer: Medicaid Other | Admitting: Obstetrics & Gynecology

## 2013-01-29 SURGERY — LAPAROSCOPY OPERATIVE
Anesthesia: General

## 2013-03-28 ENCOUNTER — Emergency Department (HOSPITAL_COMMUNITY)
Admission: EM | Admit: 2013-03-28 | Discharge: 2013-03-28 | Disposition: A | Discharge status: Home / Self Care | Payer: Medicaid Other | Attending: Emergency Medicine | Admitting: Emergency Medicine

## 2013-03-28 ENCOUNTER — Encounter (HOSPITAL_COMMUNITY): Payer: Self-pay | Admitting: *Deleted

## 2013-03-28 DIAGNOSIS — K0889 Other specified disorders of teeth and supporting structures: Secondary | ICD-10-CM

## 2013-03-28 DIAGNOSIS — Z880 Allergy status to penicillin: Secondary | ICD-10-CM | POA: Insufficient documentation

## 2013-03-28 DIAGNOSIS — Z8742 Personal history of other diseases of the female genital tract: Secondary | ICD-10-CM | POA: Insufficient documentation

## 2013-03-28 DIAGNOSIS — Z8659 Personal history of other mental and behavioral disorders: Secondary | ICD-10-CM | POA: Insufficient documentation

## 2013-03-28 DIAGNOSIS — K089 Disorder of teeth and supporting structures, unspecified: Secondary | ICD-10-CM | POA: Insufficient documentation

## 2013-03-28 DIAGNOSIS — Z79899 Other long term (current) drug therapy: Secondary | ICD-10-CM | POA: Insufficient documentation

## 2013-03-28 DIAGNOSIS — I10 Essential (primary) hypertension: Secondary | ICD-10-CM | POA: Insufficient documentation

## 2013-03-28 DIAGNOSIS — Z8619 Personal history of other infectious and parasitic diseases: Secondary | ICD-10-CM | POA: Insufficient documentation

## 2013-03-28 DIAGNOSIS — K047 Periapical abscess without sinus: Secondary | ICD-10-CM | POA: Insufficient documentation

## 2013-03-28 MED ORDER — OXYCODONE-ACETAMINOPHEN 5-325 MG PO TABS
2.0000 | ORAL_TABLET | Freq: Once | ORAL | Status: AC
  Administered 2013-03-28: 2 via ORAL
  Filled 2013-03-28: qty 2

## 2013-03-28 MED ORDER — ONDANSETRON 4 MG PO TBDP
4.0000 mg | ORAL_TABLET | Freq: Once | ORAL | Status: AC
  Administered 2013-03-28: 4 mg via ORAL
  Filled 2013-03-28: qty 1

## 2013-03-28 MED ORDER — OXYCODONE-ACETAMINOPHEN 5-325 MG PO TABS: ORAL_TABLET | ORAL | Status: DC

## 2013-03-28 MED ORDER — CIPROFLOXACIN HCL 500 MG PO TABS: 500.0000 mg | ORAL_TABLET | Freq: Two times a day (BID) | ORAL | Status: DC

## 2013-03-28 NOTE — ED Provider Notes (Signed)
History  This chart was scribed for Glade Nurse, PA-C working with Ashby Dawes, MD by Greggory Stallion, ED scribe. This patient was seen in room TR05C/TR05C and the patient's care was started at 4:48 PM.  CSN: [id number] Arrival date & time 03/28/13  1622   No chief complaint on file.  The history is provided by the patient. No language interpreter was used.    HPI Comments: Eileen [name] is a 37 y.o. female who presents to the Emergency Department complaining of gradually worsening, constant sharp right lower dental pain that started 3 days ago.]. Pt states her pain was a 4/10 a few days ago and is now 10/10. She states she has a broken tooth. Pt states she has a dentist and called but they are booked for the next month. Patient denies fever, night sweats, chills, difficulty swallowing or opening mouth, SOB, nuchal rigidity or decreased ROM of neck.  Pt did try aspirin with no relief.   Past Medical History  Diagnosis Date  . Hypertension   . Panic attacks     " a long time ago"  . Molar pregnancy   . Stillbirth   . Trichomonas    Past Surgical History  Procedure Laterality Date  . Cesarean section    . Dilation and curettage of uterus     Family History  Problem Relation Age of Onset  . Hypertension Maternal Grandfather   . Alcohol abuse Neg Hx   . Arthritis Neg Hx   . Asthma Neg Hx   . Birth defects Neg Hx   . Cancer Neg Hx   . COPD Neg Hx   . Diabetes Neg Hx   . Drug abuse Neg Hx   . Early death Neg Hx   . Hearing loss Neg Hx   . Hyperlipidemia Neg Hx   . Heart disease Neg Hx   . Kidney disease Neg Hx   . Learning disabilities Neg Hx   . Mental illness Neg Hx   . Mental retardation Neg Hx   . Miscarriages / Stillbirths Neg Hx   . Stroke Neg Hx   . Vision loss Neg Hx   . Depression Cousin    History  Substance Use Topics  . Smoking status: Never Smoker   . Smokeless tobacco: Never Used  . Alcohol Use: No   OB History   Grav Para Term Preterm  Abortions TAB SAB Ect Mult Living   9 7 6 1 2  0 2 0 0 6     Review of Systems  HENT: Positive for dental problem. Negative for sore throat, trouble swallowing, neck stiffness and voice change.   Eyes: Negative for photophobia and visual disturbance.  Respiratory: Negative for shortness of breath.   Cardiovascular: Negative for chest pain.  Gastrointestinal: Negative for nausea and vomiting.  Musculoskeletal: Negative for back pain.  Skin: Negative for rash.  Neurological: Negative for weakness, light-headedness and numbness.  Psychiatric/Behavioral: The patient is not nervous/anxious.     Allergies  Penicillins  Home Medications   Current Outpatient Rx  Name  Route  Sig  Dispense  Refill  . acetaminophen (TYLENOL) 325 MG tablet   Oral   Take 650 mg by mouth daily as needed for pain.          . Ca Carbonate-Mag Hydroxide (ROLAIDS PO)   Oral   Take 2 tablets by mouth 2 (two) times daily as needed (heartburn).         . enalapril-hydrochlorothiazide (VASERETIC)  10-25 MG per tablet   Oral   Take 1 tablet by mouth daily.   30 tablet   3   . famotidine (PEPCID) 40 MG tablet   Oral   Take 1 tablet (40 mg total) by mouth once.   30 tablet   0   . ibuprofen (ADVIL,MOTRIN) 600 MG tablet   Oral   Take 1 tablet (600 mg total) by mouth every 6 (six) hours.   30 tablet   1    BP 124/88  Pulse 139  Temp(Src) 98.8 F (37.1 C) (Oral)  Resp 20  Ht 5\' 5"  (1.651 m)  SpO2 98%  LMP 03/14/2013  Breastfeeding? No  Physical Exam  Nursing note and vitals reviewed. Constitutional: She is oriented to person, place, and time. She appears well-developed and well-nourished.  HENT:  Head: Normocephalic and atraumatic. No trismus in the jaw.  Mouth/Throat: No dental abscesses. No tonsillar abscesses.  Oropharynx is clear. Teeth appear carious. Very tender at tooth #31 with erythema and edema of the underlying gingiva. Multiple other teeth have grounds or are previously  extracted. Neck is nontender and supple without adenopathy or JVD. Abscess between the teeth at 30 and 31.   Neck: Normal range of motion. Neck supple.  Cardiovascular: Normal rate.   Pulmonary/Chest: Effort normal. No respiratory distress. She has no wheezes.  Abdominal: Soft. There is no tenderness.  Musculoskeletal: Normal range of motion.  Neurological: She is alert and oriented to person, place, and time. No cranial nerve deficit.  Skin: Skin is warm and dry.  Psychiatric: She has a normal mood and affect.    ED Course  Procedures (including critical care time)  DIAGNOSTIC STUDIES: Oxygen Saturation is 98% on RA, normal by my interpretation.    COORDINATION OF CARE: 5:02 PM-Discussed treatment plan which includes IND of abscess with pt at bedside and pt agreed to plan. Advised pt to follow up with her dentist this week.   INCISION AND DRAINAGE Performed by: Glade Nurse, PA-C Consent: Verbal consent obtained. Risks and benefits: risks, benefits and alternatives were discussed Type: abscess  Body area: between tooth 31 and 32  Anesthesia: local infiltration  Incision was made with an 18 gauge needle.  Local anesthetic: dental block  Anesthetic total: 5 ml  Complexity: simple  Drainage: serosanguinous   Drainage amount: minor  Packing material: dental gauze  Patient tolerance: Patient tolerated the procedure well with no immediate complications.    Labs Reviewed - No data to display No results found. 1. Pain, dental     MDM  Patient with dental pain.  No gross abscess.  Exam unconcerning for Ludwig's angina or spread of infection.  Will treat with penicillin and pain medicine.  Urged patient to follow-up with dentist.  Provided resource material. Discussed options for treatment with pt who understands and is in agreement with discharge plan.   I personally performed the services described in this documentation, which was scribed in my presence. The  recorded information has been reviewed and is accurate.    Glade Nurse, PA-C 03/30/13 1239

## 2013-03-28 NOTE — Discharge Instructions (Signed)
1. You may take up to 800 mg of ibuprofen (this is typically 4 over the counter pills) up to three times a day for your pain. For breakthrough pain you may take your prescribed Percocet. Do not take other medicines containing acetaminophen as Percocet contains acetaminophen (read the labels!).  Do not drink alcohol, drive, or operate heavy machinery while taking Percocet.  2. You have been prescribed an antibiotic for today. Please take as directed and take it to completion.  3. RESOURCE GUIDE  If you do not have a primary care doctor to follow up with regarding today's visit, please call the [facility] at [phone] to make an appointment. Hours of operation are 10am - 7pm, Monday through Friday, and they have a sliding scale fee.    Dental Assistance Please contact the on-call dentist listed on your discharge papers WITHIN 48 HOURS.  If the on-call dentist agrees that your condition is emergent, there is a CHANCE (not a guarantee) that you MAY receive a savings on your visit at this point. If you wait more than 48 hours, it will not be considered an emergency.   Drs. Moreen Fowler Civils:  [address], [place], [place], [address], [phone]  Short-notice availability  Tooth evaluation: $100  Emergency Treatment: $200 (including exam, xrays, tooth extraction, and post-op visit)  Patients with Medicaid: [facility] [phone] [address], [phone] [address] [phone]  If unable to pay, or uninsured, contact [facility] [phone] in [place], [phone] in [facility]) to become qualified for the adult dental clinic  Other Low-Cost Community Dental Services: - [facility]: [address] [place], [place], [place], [address], [phone], [phone], 2nd and 4th Thursday of the month at 6:30am.  10 clients each day by appointment, can sometimes see walk-in patients if someone does not show for an appointment. [facility]:  [address] [place], [place], [address], [phone] [facility]:  [address] [place], [place], [address], [phone] [facility] Health Department:  [phone] [facility] Health Department:  [phone] [facility] Health Department:  [phone]

## 2013-03-28 NOTE — ED Notes (Signed)
Pt reports a 3day hx of dental pain . Tooth located on RT lower jaw.

## 2013-03-31 NOTE — ED Provider Notes (Signed)
Medical screening examination/treatment/procedure(s) were performed by non-physician practitioner and as supervising physician I was immediately available for consultation/collaboration.   Cache Bills Ann Abubakar Crispo, MD 03/31/13 0817 

## 2013-04-23 ENCOUNTER — Other Ambulatory Visit (HOSPITAL_COMMUNITY): Payer: Self-pay | Admitting: Obstetrics & Gynecology

## 2013-04-23 ENCOUNTER — Telehealth: Payer: Self-pay | Admitting: *Deleted

## 2013-04-23 NOTE — Telephone Encounter (Signed)
Per chart review refill request sent by computer to Dr. Sharon Seller with Dr. Penne Lash and she approved the refill request. Eileen [name] and notified her it was approved , she should go ahead and see MD at [facility] clinic as soon as she can for further refills

## 2013-04-23 NOTE — Telephone Encounter (Signed)
Lamya called and left a message stating she wants to know if she can get some more blood pressure pills.  States the doctor she is going to start seeing at [facility] on [address] charges a $3 copay and she doesn't have that right now.  States she ran out of her pills- was taking one daily but doctor told her to increase- states was called enlap-hctz  10-25 mg. Dr. Penne Lash - said pharmacy and er told her to call clinic.

## 2013-05-21 ENCOUNTER — Telehealth: Payer: Self-pay | Admitting: General Practice

## 2013-05-21 NOTE — Telephone Encounter (Signed)
Patient called and left message stating she would like a refill on her blood pressure pills and that she called some doctors office for a refill but her appt is not until 10/24 and she needs a refill now because she is out of her pills and her blood pressure is high and the doctors office told her to call us here and ask for a refill.

## 2013-05-22 MED ORDER — ENALAPRIL-HYDROCHLOROTHIAZIDE 10-25 MG PO TABS: ORAL_TABLET | ORAL | Status: DC

## 2013-05-22 NOTE — Telephone Encounter (Signed)
Rx refill authorized by Dr. Shawnie Pons and e-prescribed to pt's pharmacy with 1 refill so that pt will have enough med until her PCP appt on 10/24.  Called pt and spoke to a female who stated that she was not around [place] right now. She advised me to call back tomorrow morning at 1000.

## 2013-05-23 NOTE — Telephone Encounter (Signed)
Called patient, no answer- left message that we are returning your phone call, please call us back at the clinics 

## 2013-05-28 NOTE — Telephone Encounter (Signed)
Called patient, no answer- left message stating we are trying to reach her, she has a medication available for pickup at her [facility] pharmacy and to call us back at the clinics if she has additional questions or concerns

## 2013-06-21 ENCOUNTER — Encounter (HOSPITAL_COMMUNITY): Payer: Self-pay | Admitting: Emergency Medicine

## 2013-06-21 ENCOUNTER — Emergency Department (HOSPITAL_COMMUNITY)
Admission: EM | Admit: 2013-06-21 | Discharge: 2013-06-21 | Disposition: A | Discharge status: Home / Self Care | Payer: Medicaid Other | Attending: Emergency Medicine | Admitting: Emergency Medicine

## 2013-06-21 DIAGNOSIS — Z8619 Personal history of other infectious and parasitic diseases: Secondary | ICD-10-CM | POA: Insufficient documentation

## 2013-06-21 DIAGNOSIS — K089 Disorder of teeth and supporting structures, unspecified: Secondary | ICD-10-CM | POA: Insufficient documentation

## 2013-06-21 DIAGNOSIS — K0889 Other specified disorders of teeth and supporting structures: Secondary | ICD-10-CM

## 2013-06-21 DIAGNOSIS — Z8659 Personal history of other mental and behavioral disorders: Secondary | ICD-10-CM | POA: Insufficient documentation

## 2013-06-21 DIAGNOSIS — Z880 Allergy status to penicillin: Secondary | ICD-10-CM | POA: Insufficient documentation

## 2013-06-21 DIAGNOSIS — Z79899 Other long term (current) drug therapy: Secondary | ICD-10-CM | POA: Insufficient documentation

## 2013-06-21 DIAGNOSIS — K029 Dental caries, unspecified: Secondary | ICD-10-CM | POA: Insufficient documentation

## 2013-06-21 DIAGNOSIS — R220 Localized swelling, mass and lump, head: Secondary | ICD-10-CM | POA: Insufficient documentation

## 2013-06-21 DIAGNOSIS — K002 Abnormalities of size and form of teeth: Secondary | ICD-10-CM | POA: Insufficient documentation

## 2013-06-21 DIAGNOSIS — I10 Essential (primary) hypertension: Secondary | ICD-10-CM | POA: Insufficient documentation

## 2013-06-21 DIAGNOSIS — Z792 Long term (current) use of antibiotics: Secondary | ICD-10-CM | POA: Insufficient documentation

## 2013-06-21 DIAGNOSIS — Z791 Long term (current) use of non-steroidal anti-inflammatories (NSAID): Secondary | ICD-10-CM | POA: Insufficient documentation

## 2013-06-21 MED ORDER — IBUPROFEN 800 MG PO TABS
800.0000 mg | ORAL_TABLET | Freq: Once | ORAL | Status: AC
  Administered 2013-06-21: 800 mg via ORAL
  Filled 2013-06-21: qty 1

## 2013-06-21 MED ORDER — CLINDAMYCIN HCL 150 MG PO CAPS: 300.0000 mg | ORAL_CAPSULE | Freq: Three times a day (TID) | ORAL | Status: DC

## 2013-06-21 MED ORDER — IBUPROFEN 800 MG PO TABS: 800.0000 mg | ORAL_TABLET | Freq: Three times a day (TID) | ORAL | Status: DC

## 2013-06-21 NOTE — ED Provider Notes (Signed)
History/physical exam/procedure(s) were performed by non-physician practitioner and as supervising physician I was immediately available for consultation/collaboration. I have reviewed all notes and am in agreement with care and plan.   Hilario Quarry, MD 06/21/13 1043

## 2013-06-21 NOTE — ED Notes (Signed)
Patient with right sided dental pain, patient states bad tooth for a while and will hurt and then get better then cycle again

## 2013-06-21 NOTE — ED Provider Notes (Signed)
CSN: [id number]     Arrival date & time 06/21/13  4098 History   First MD Initiated Contact with Patient 06/21/13 0747     Chief Complaint  Patient presents with  . Dental Pain   (Consider location/radiation/quality/duration/timing/severity/associated sxs/prior Treatment) Patient is a 37 y.o. female presenting with tooth pain. The history is provided by the patient. No language interpreter was used.  Dental Pain Location:  Lower Lower teeth location:  30/RL 1st molar Quality:  Aching and throbbing Severity:  Mild Onset quality:  Gradual Duration:  3 days Timing:  Intermittent Progression:  Unchanged Chronicity:  New Context: dental caries and poor dentition   Context: not trauma   Relieved by:  Nothing Worsened by:  Touching and pressure Ineffective treatments: Saltwater gargle. Associated symptoms: gum swelling   Associated symptoms: no difficulty swallowing, no drooling, no facial pain, no facial swelling, no fever, no neck pain, no neck swelling, no oral bleeding, no oral lesions and no trismus   Risk factors: lack of dental care     Past Medical History  Diagnosis Date  . Hypertension   . Panic attacks     " a long time ago"  . Molar pregnancy   . Stillbirth   . Trichomonas    Past Surgical History  Procedure Laterality Date  . Cesarean section    . Dilation and curettage of uterus     Family History  Problem Relation Age of Onset  . Hypertension Maternal Grandfather   . Alcohol abuse Neg Hx   . Arthritis Neg Hx   . Asthma Neg Hx   . Birth defects Neg Hx   . Cancer Neg Hx   . COPD Neg Hx   . Diabetes Neg Hx   . Drug abuse Neg Hx   . Early death Neg Hx   . Hearing loss Neg Hx   . Hyperlipidemia Neg Hx   . Heart disease Neg Hx   . Kidney disease Neg Hx   . Learning disabilities Neg Hx   . Mental illness Neg Hx   . Mental retardation Neg Hx   . Miscarriages / Stillbirths Neg Hx   . Stroke Neg Hx   . Vision loss Neg Hx   . Depression Cousin     History  Substance Use Topics  . Smoking status: Never Smoker   . Smokeless tobacco: Never Used  . Alcohol Use: No   OB History   Grav Para Term Preterm Abortions TAB SAB Ect Mult Living   9 7 6 1 2  0 2 0 0 6     Review of Systems  Constitutional: Negative for fever.  HENT: Positive for dental problem. Negative for drooling, ear discharge, ear pain, facial swelling, mouth sores and trouble swallowing.   Respiratory: Negative for shortness of breath.   Musculoskeletal: Negative for neck pain and neck stiffness.  All other systems reviewed and are negative.    Allergies  Penicillins  Home Medications   Current Outpatient Rx  Name  Route  Sig  Dispense  Refill  . acetaminophen (TYLENOL) 500 MG tablet   Oral   Take 500 mg by mouth daily as needed for pain.         Marland Kitchen enalapril-hydrochlorothiazide (VASERETIC) 10-25 MG per tablet      TAKE ONE TABLET BY MOUTH ONCE DAILY   30 tablet   1   . clindamycin (CLEOCIN) 150 MG capsule   Oral   Take 2 capsules (300 mg total) by mouth  3 (three) times daily. May dispense as 150mg  capsules   60 capsule   0   . ibuprofen (ADVIL,MOTRIN) 800 MG tablet   Oral   Take 1 tablet (800 mg total) by mouth 3 (three) times daily.   21 tablet   0    BP 134/97  Pulse 110  Temp(Src) 98.6 F (37 C) (Oral)  Resp 16  SpO2 100%  LMP 06/08/2013  Breastfeeding? No  Physical Exam  Nursing note and vitals reviewed. Constitutional: She is oriented to person, place, and time. She appears well-developed and well-nourished. No distress.  HENT:  Head: Normocephalic and atraumatic.  Right Ear: Tympanic membrane, external ear and ear canal normal. No mastoid tenderness.  Left Ear: Tympanic membrane, external ear and ear canal normal. No mastoid tenderness.  Nose: Nose normal.  Mouth/Throat: Uvula is midline, oropharynx is clear and moist and mucous membranes are normal. No trismus in the jaw. Abnormal dentition. Dental caries present. No uvula  swelling.    Tenderness to palpation of the right lower first molar with surrounding gingival swelling. No oral lesions or bleeding appreciated. No area of fluctuance to suspect drainable dental abscess. No trismus or stridor noted. Airway patent and patient tolerating secretions without difficulty.  Eyes: Conjunctivae and EOM are normal. Pupils are equal, round, and reactive to light. No scleral icterus.  Neck: Normal range of motion. Neck supple.  Patient moves neck with ease; no tenderness or stiffness appreciated.  Cardiovascular: Normal rate, regular rhythm, normal heart sounds and intact distal pulses.   Pulmonary/Chest: Effort normal. No stridor. No respiratory distress.  Musculoskeletal: Normal range of motion.  Neurological: She is alert and oriented to person, place, and time.  Skin: Skin is warm and dry. No rash noted. She is not diaphoretic. No erythema. No pallor.  Psychiatric: She has a normal mood and affect. Her behavior is normal.    ED Course  Procedures (including critical care time) Labs Review Labs Reviewed - No data to display  Imaging Review No results found.  MDM   1. Pain, dental    37 year old presents for dental pain x3 days. Patient is well and nontoxic appearing, hemodynamically stable, and afebrile. Physical exam findings as above. Airway patent and patient tolerating secretions without difficulty. There is no trismus or stridor. No facial swelling appreciated. No area of fluctuance to suspect drainable dental abscess. No red flags or signs concerning for Ludwig's angina. Patient appropriate for discharge with dental and oral surgery followup for further evaluation of symptoms. Ibuprofen recommended for pain control and clindamycin prescribed to cover for infection as patient allergic to penicillin. Return precautions discussed and patient agreeable to plan with no unaddressed concerns.    Antony Madura, PA-C 06/21/13 [phone]

## 2013-07-12 ENCOUNTER — Other Ambulatory Visit: Payer: Self-pay | Admitting: Family Medicine

## 2013-07-12 ENCOUNTER — Telehealth: Payer: Self-pay

## 2013-07-12 NOTE — Telephone Encounter (Signed)
Pt. Called stating that she is a patient of Dr.Leggets and had BP medication ordered and sent to her [facility] pharmacy at her last visit. Pt. Stated she did not pick up the prescription within the allotted time and [facility] told her she would need to have the prescription reordered. Pt. Stated that her daughter takes her phone to school but she would like to know if we can reorder the prescription and call and leave a message telling her if we are able to.

## 2013-07-12 NOTE — Telephone Encounter (Signed)
Called [facility] pharmacy to see if they would allow patient to pick up her BP medication that she had not picked up within the allotted time. [facility] said of course they would because it would be on file whether or not she had the refill. According to pharmacist, Pt. Had picked up BP medication on 05/22/13 and 06/18/13 and is therefore not eligible to get a refill without a new order. In reviewing notes it appears pt. Was supposed to have had a PCP appointment on 07/06/13 where she should have been able to get a medication refill.   Called patient and left message asking that she call us back during business hours as we are trying to return her call.

## 2013-10-15 ENCOUNTER — Telehealth: Payer: Self-pay | Admitting: General Practice

## 2013-10-15 ENCOUNTER — Other Ambulatory Visit: Payer: Self-pay | Admitting: Family Medicine

## 2013-10-15 DIAGNOSIS — I10 Essential (primary) hypertension: Secondary | ICD-10-CM

## 2013-10-15 NOTE — Telephone Encounter (Addendum)
Patient called and left a message stating she was a patient here and she was getting her blood pressure from us and the last time she took her blood pressure pills was the last time we prescribed it to her and she went to the ER recently but they wouldn't refill the pills and lately she's been feeling bad because she hasn't taken her blood pressure medicine in so long and she's really sorry that she has been lying to us but that she has not gone to see a primary care dr because she doesn't have the money to be seen there but she is really sorry that she lied to us but really needs a refill on this medication because she has been feeling bad. Patient left callback # of [phone]. Called patient, no answer- left message that we are trying to return your phone call, please call us back at the clinics and let us know if we can leave detailed information on your voicemail

## 2013-10-16 NOTE — Telephone Encounter (Signed)
Eileen [name] left another message she missed our call and is calling back about her blood pressure meds- states she knows she probably can't get a refill, but wants a call back about what to do.  [name] unable to [facility] a message- heard voicemail was full.  [facility]- left message for referral nurse to call clinic.

## 2013-10-24 NOTE — Telephone Encounter (Signed)
Unable to leave voicemail.

## 2013-11-23 ENCOUNTER — Telehealth: Payer: Self-pay | Admitting: *Deleted

## 2013-11-23 MED ORDER — ENALAPRIL-HYDROCHLOROTHIAZIDE 10-25 MG PO TABS: ORAL_TABLET | ORAL | Status: DC

## 2013-11-23 NOTE — Telephone Encounter (Signed)
Pt left message stating that she needs refill of her BP medication. Per chart review, pt was last given refill on 10/16/13 and I confirmed with her Pharmacy that this Rx was received. [facility] of her HTN on 10/16/13. It does not appear that pt was given appt or that any contact was made with her. I called and scheduled appt on 3/24 @ 1000. I then called pt and left detailed message that we can refill her medication one last time. I provided information about [facility] and stated that she must be seen by them for care of her HTN. She may change the appt if needed but must be seen within 30 days as we will no longer refill her medication. Their tel number is [phone].  She may call back if she has questions.

## 2013-12-04 ENCOUNTER — Ambulatory Visit: Payer: Medicaid Other

## 2014-04-10 ENCOUNTER — Emergency Department (HOSPITAL_COMMUNITY)
Admission: EM | Admit: 2014-04-10 | Discharge: 2014-04-10 | Disposition: A | Discharge status: Home / Self Care | Payer: Medicaid Other | Attending: Emergency Medicine | Admitting: Emergency Medicine

## 2014-04-10 ENCOUNTER — Encounter (HOSPITAL_COMMUNITY): Payer: Self-pay | Admitting: Emergency Medicine

## 2014-04-10 DIAGNOSIS — Z880 Allergy status to penicillin: Secondary | ICD-10-CM | POA: Diagnosis not present

## 2014-04-10 DIAGNOSIS — I10 Essential (primary) hypertension: Secondary | ICD-10-CM | POA: Insufficient documentation

## 2014-04-10 DIAGNOSIS — L03011 Cellulitis of right finger: Secondary | ICD-10-CM

## 2014-04-10 DIAGNOSIS — M79609 Pain in unspecified limb: Secondary | ICD-10-CM | POA: Insufficient documentation

## 2014-04-10 DIAGNOSIS — Z8659 Personal history of other mental and behavioral disorders: Secondary | ICD-10-CM | POA: Insufficient documentation

## 2014-04-10 DIAGNOSIS — L03019 Cellulitis of unspecified finger: Secondary | ICD-10-CM | POA: Diagnosis not present

## 2014-04-10 DIAGNOSIS — Z8619 Personal history of other infectious and parasitic diseases: Secondary | ICD-10-CM | POA: Diagnosis not present

## 2014-04-10 MED ORDER — SULFAMETHOXAZOLE-TRIMETHOPRIM 800-160 MG PO TABS: 2.0000 | ORAL_TABLET | Freq: Two times a day (BID) | ORAL | Status: DC

## 2014-04-10 MED ORDER — HYDROCODONE-ACETAMINOPHEN 5-325 MG PO TABS: ORAL_TABLET | ORAL | Status: DC

## 2014-04-10 NOTE — Discharge Instructions (Signed)
Please read and follow all provided instructions.  Your diagnoses today include:  1. Paronychia, right     Tests performed today include:  Vital signs. See below for your results today.   Medications prescribed:   Bactrim (trimethoprim/sulfamethoxazole) - antibiotic  You have been prescribed an antibiotic medicine: take the entire course of medicine even if you are feeling better. Stopping early can cause the antibiotic not to work.   Vicodin (hydrocodone/acetaminophen) - narcotic pain medication  DO NOT drive or perform any activities that require you to be awake and alert because this medicine can make you drowsy. BE VERY CAREFUL not to take multiple medicines containing Tylenol (also called acetaminophen). Doing so can lead to an overdose which can damage your liver and cause liver failure and possibly death.  Take any prescribed medications only as directed.   Home care instructions:   Follow any educational materials contained in this packet  Follow-up instructions: Return to the Emergency Department in 48 hours for a recheck if your symptoms are not significantly improved.  Please follow-up with the hand doctor listed in 5 days if not improved.   Return instructions:  Return to the Emergency Department if you have:  Fever  Worsening symptoms  Worsening pain  Worsening swelling  Redness of the skin that moves away from the affected area, especially if it streaks away from the affected area   Any other emergent concerns  Your vital signs today were: BP 127/91   Pulse 115   Temp(Src) 98 F (36.7 C) (Oral)   Resp 18   SpO2 100%   LMP 02/27/2014 If your blood pressure (BP) was elevated above 135/85 this visit, please have this repeated by your doctor within one month. --------------

## 2014-04-10 NOTE — ED Provider Notes (Signed)
CSN: [id number]     Arrival date & time 04/10/14  0811 History  This chart was scribed for non-physician practitioner, Renne CriglerJoshua Shary Lamos, PA-C, working with Dagmar HaitWilliam Blair Walden, MD by Charline BillsEssence Howell, ED Scribe. This patient was seen in room TR05C/TR05C and the patient's care was started at 9:04 AM.  Chief Complaint  Patient presents with  . Hand Pain   The history is provided by the patient. No language interpreter was used.   HPI Comments: Eileen [name] is a 38 y.o. female who presents to the Emergency Department complaining of constant R middle finger pain onset 2 weeks ago. Pt describes the pain as a throbbing sensation. She reports associated itching and swelling around the cuticle. Pt states that swelling has improved by applying alcohol to her finger. She denies fever. Pt has not taking any medication for pain. No known allergies.  No PCP.  Past Medical History  Diagnosis Date  . Hypertension   . Panic attacks     " a long time ago"  . Molar pregnancy   . Stillbirth   . Trichomonas    Past Surgical History  Procedure Laterality Date  . Cesarean section    . Dilation and curettage of uterus     Family History  Problem Relation Age of Onset  . Hypertension Maternal Grandfather   . Alcohol abuse Neg Hx   . Arthritis Neg Hx   . Asthma Neg Hx   . Birth defects Neg Hx   . Cancer Neg Hx   . COPD Neg Hx   . Diabetes Neg Hx   . Drug abuse Neg Hx   . Early death Neg Hx   . Hearing loss Neg Hx   . Hyperlipidemia Neg Hx   . Heart disease Neg Hx   . Kidney disease Neg Hx   . Learning disabilities Neg Hx   . Mental illness Neg Hx   . Mental retardation Neg Hx   . Miscarriages / Stillbirths Neg Hx   . Stroke Neg Hx   . Vision loss Neg Hx   . Depression Cousin    History  Substance Use Topics  . Smoking status: Never Smoker   . Smokeless tobacco: Never Used  . Alcohol Use: No   OB History   Grav Para Term Preterm Abortions TAB SAB Ect Mult Living   9 7 6 1 2  0 2 0 0 6      Review of Systems  Constitutional: Negative for fever.  Gastrointestinal: Negative for nausea and vomiting.  Skin: Negative for color change.       Positive for abscess.  Hematological: Negative for adenopathy.   Allergies  Penicillins  Home Medications   Prior to Admission medications   Not on File   Triage Vitals: BP 127/91  Pulse 115  Temp(Src) 98 F (36.7 C) (Oral)  Resp 18  SpO2 100%  LMP 02/27/2014 Physical Exam  Nursing note and vitals reviewed. Constitutional: She appears well-developed and well-nourished.  HENT:  Head: Normocephalic and atraumatic.  Eyes: Conjunctivae are normal.  Neck: Normal range of motion. Neck supple.  Pulmonary/Chest: Effort normal. No respiratory distress.  Neurological: She is alert.  Skin: Skin is warm and dry.  Right middle digit paronychia. Patient with large area of swelling and fluctuance at base of nail bed. No cellulitis. No felon palpated.   Psychiatric: She has a normal mood and affect. Her behavior is normal.   ED Course  Procedures (including critical care time) DIAGNOSTIC STUDIES:  Oxygen Saturation is 100% on RA, normal by my interpretation.    COORDINATION OF CARE: 9:08 AM-Discussed treatment plan which includes I&D and return precautions with pt at bedside and pt agreed to plan.   Labs Review Labs Reviewed - No data to display  Imaging Review No results found.   EKG Interpretation None      INCISION AND DRAINAGE Performed by: Carolee Rota Consent: Verbal consent obtained. Risks and benefits: risks, benefits and alternatives were discussed Type: abscess  Body area: R middle digit  Anesthesia: local infiltration  Incision was made with a scalpel.  Local anesthetic: lidocaine 1% without epinephrine  Anesthetic total: 1 ml  Complexity: complex  Drainage: purulent  Drainage amount: moderate  Packing material: none  Patient tolerance: Patient tolerated the procedure well with no immediate  complications.   9:23 AM The patient was urged to return to the Emergency Department urgently with worsening pain, swelling, expanding erythema especially if it streaks away from the affected area, fever, or if they have any other concerns.   The patient was urged to return to the Emergency Department or go to their PCP in 48 hours for wound recheck if the area is not significantly improved.  Hand followup given if not improved in 5 days.  The patient verbalized understanding and stated agreement with this plan.   Patient counseled on use of narcotic pain medications. Counseled not to combine these medications with others containing tylenol. Urged not to drink alcohol, drive, or perform any other activities that requires focus while taking these medications. The patient verbalizes understanding and agrees with the plan.    MDM   Final diagnoses:  Paronychia, right   Patient with right third digit paronychia without cellulitis. Given large size of area, area was drained and patient was started on a course of Bactrim.   Hand referral if symptoms not improved with warm soaks and abx after successful I&D.   I personally performed the services described in this documentation, which was scribed in my presence. The recorded information has been reviewed and is accurate.    Renne Crigler, PA-C 04/10/14 [phone]

## 2014-04-10 NOTE — ED Notes (Signed)
Paronychia to R middle finger.   Patient states doesn't hurt unless someone grabs it.

## 2014-04-10 NOTE — ED Notes (Signed)
Pt reports swelling to right middle finger and cuticle area x 2 weeks causing throbbing pain.

## 2014-04-10 NOTE — ED Provider Notes (Signed)
Medical screening examination/treatment/procedure(s) were performed by non-physician practitioner and as supervising physician I was immediately available for consultation/collaboration.   EKG Interpretation None        William Nedra Mcinnis, MD 04/10/14 1444 

## 2014-05-28 ENCOUNTER — Inpatient Hospital Stay (HOSPITAL_COMMUNITY)
Admission: AD | Admit: 2014-05-28 | Discharge: 2014-05-28 | Disposition: A | Discharge status: Home / Self Care | Payer: Medicaid Other | Source: Ambulatory Visit | Attending: Obstetrics & Gynecology | Admitting: Obstetrics & Gynecology

## 2014-05-28 ENCOUNTER — Encounter (HOSPITAL_COMMUNITY): Payer: Self-pay | Admitting: *Deleted

## 2014-05-28 DIAGNOSIS — N3289 Other specified disorders of bladder: Secondary | ICD-10-CM | POA: Diagnosis present

## 2014-05-28 DIAGNOSIS — Z3201 Encounter for pregnancy test, result positive: Secondary | ICD-10-CM | POA: Diagnosis not present

## 2014-05-28 LAB — URINALYSIS, ROUTINE W REFLEX MICROSCOPIC
BILIRUBIN URINE: NEGATIVE
GLUCOSE, UA: NEGATIVE mg/dL
KETONES UR: NEGATIVE mg/dL
LEUKOCYTES UA: NEGATIVE
NITRITE: NEGATIVE
PROTEIN: NEGATIVE mg/dL
Specific Gravity, Urine: <1.005 — ABNORMAL LOW (ref 1.005–1.030)
Urobilinogen, UA: 0.2 mg/dL (ref 0.0–1.0)
pH: 6.5 (ref 5.0–8.0)

## 2014-05-28 LAB — URINE MICROSCOPIC-ADD ON

## 2014-05-28 LAB — POCT PREGNANCY, URINE: Preg Test, Ur: POSITIVE — AB

## 2014-05-28 NOTE — Progress Notes (Signed)
Pt was able to void without difficulty.

## 2014-05-28 NOTE — MAU Provider Note (Signed)
History     CSN: [id number]  Arrival date and time: 05/28/14 9528   First Provider Initiated Contact with Patient 05/28/14 [phone]      Chief Complaint  Patient presents with  . unable to urinate    HPI [name] is a 38 y.o. [id number] at [redacted]w[redacted]d who presents to MAU today with complaint of unable to void. Patient states that she has been unable to pass urine since 2315 yesterday. She states that this occurred one other time in 2014 and she had a straight cath used to empty her bladder and then no other problems after that. She also states that she had +HPT recently. She denies abdominal pain, vaginal bleeding, discharge or fever. She has had some nausea without vomiting.   OB History   Grav Para Term Preterm Abortions TAB SAB Ect Mult Living   0 2 0 0 6      Past Medical History  Diagnosis Date  . Hypertension   . Panic attacks     " a long time ago"  . Molar pregnancy   . Stillbirth   . Trichomonas     Past Surgical History  Procedure Laterality Date  . Cesarean section    . Dilation and curettage of uterus      Family History  Problem Relation Age of Onset  . Hypertension Maternal Grandfather   . Alcohol abuse Neg Hx   . Arthritis Neg Hx   . Asthma Neg Hx   . Birth defects Neg Hx   . Cancer Neg Hx   . COPD Neg Hx   . Diabetes Neg Hx   . Drug abuse Neg Hx   . Early death Neg Hx   . Hearing loss Neg Hx   . Hyperlipidemia Neg Hx   . Heart disease Neg Hx   . Kidney disease Neg Hx   . Learning disabilities Neg Hx   . Mental illness Neg Hx   . Mental retardation Neg Hx   . Miscarriages / Stillbirths Neg Hx   . Stroke Neg Hx   . Vision loss Neg Hx   . Depression Cousin     History  Substance Use Topics  . Smoking status: Never Smoker   . Smokeless tobacco: Never Used  . Alcohol Use: No    Allergies:  Allergies  Allergen Reactions  . Penicillins Shortness Of Breath and Swelling    Prescriptions prior to admission  Medication Sig  Dispense Refill  . HYDROcodone-acetaminophen (NORCO/VICODIN) 5-325 MG per tablet Take 1-2 tablets every 6 hours as needed for severe pain  6 tablet  0  . sulfamethoxazole-trimethoprim (BACTRIM DS,SEPTRA DS) 800-160 MG per tablet Take 2 tablets by mouth 2 (two) times daily.  28 tablet  0    Review of Systems  Constitutional: Negative for fever and malaise/fatigue.  Gastrointestinal: Positive for nausea. Negative for vomiting, abdominal pain, diarrhea and constipation.  Genitourinary: Negative for dysuria, urgency and frequency.       Neg - vaginal bleeding, discharge   Physical Exam   Blood pressure 127/85, pulse 80, temperature 98.1 F (36.7 C), temperature source Oral, resp. rate 18, last menstrual period 03/27/2014, not currently breastfeeding.  Physical Exam  Constitutional: She is oriented to person, place, and time. She appears well-developed and well-nourished. No distress.  HENT:  Head: Normocephalic.  Cardiovascular: Normal rate.   Respiratory: Effort normal.  Neurological: She is alert and oriented to person, place, and time.  Skin:  Skin is warm and dry. No erythema.  Psychiatric: She has a normal mood and affect.   Results for orders placed during the hospital encounter of 05/28/14 (from the past 24 hour(s))  URINALYSIS, ROUTINE W REFLEX MICROSCOPIC     Status: Abnormal   Collection Time    05/28/14  7:17 AM      Result Value Ref Range   Color, Urine YELLOW  YELLOW   APPearance CLEAR  CLEAR   Specific Gravity, Urine <1.005 (*) 1.005 - 1.030   pH 6.5  5.0 - 8.0   Glucose, UA NEGATIVE  NEGATIVE mg/dL   Hgb urine dipstick TRACE (*) NEGATIVE   Bilirubin Urine NEGATIVE  NEGATIVE   Ketones, ur NEGATIVE  NEGATIVE mg/dL   Protein, ur NEGATIVE  NEGATIVE mg/dL   Urobilinogen, UA 0.2  0.0 - 1.0 mg/dL   Nitrite NEGATIVE  NEGATIVE   Leukocytes, UA NEGATIVE  NEGATIVE  URINE MICROSCOPIC-ADD ON     Status: None   Collection Time    05/28/14  7:17 AM      Result Value Ref Range    Squamous Epithelial / LPF RARE  RARE   RBC / HPF 0-2  <3 RBC/hpf   Bacteria, UA RARE  RARE  POCT PREGNANCY, URINE     Status: Abnormal   Collection Time    05/28/14  7:35 AM      Result Value Ref Range   Preg Test, Ur POSITIVE (*) NEGATIVE    MAU Course  Procedures None  MDM +UPT 850 mL urine voided from bladder with straight catheter today. UA today Patient give water to sip in MAU to ensure that she can void on her own prior to discharge.  Pregnancy confirmation letter given for Medicaid.  Patient able to void in MAU today Assessment and Plan  A: Bladder distention Positive pregnancy test  P: Discharge home Patient given pregnancy confirmation letter and contact information for [facility] First trimester warning signs discussed Patient may return to MAU as needed or if her condition were to change or worsen   Marny Lowenstein, PA-C  05/28/2014, 7:57 AM

## 2014-05-28 NOTE — Discharge Instructions (Signed)

## 2014-05-28 NOTE — MAU Note (Addendum)
Unable to void since 2315. States this has happened once before. + HPT.

## 2014-05-29 NOTE — MAU Provider Note (Signed)
Attestation of Attending Supervision of Advanced Practitioner (PA/CNM/NP): Evaluation and management procedures were performed by the Advanced Practitioner under my supervision and collaboration.  I have reviewed the Advanced Practitioner's note and chart, and I agree with the management and plan.  Abryana Lykens, MD, FACOG Attending Obstetrician & Gynecologist Faculty Practice, [facility]   

## 2014-06-05 ENCOUNTER — Emergency Department (HOSPITAL_COMMUNITY)
Admission: EM | Admit: 2014-06-05 | Discharge: 2014-06-05 | Disposition: A | Discharge status: Home / Self Care | Payer: Medicaid Other | Attending: Emergency Medicine | Admitting: Emergency Medicine

## 2014-06-05 ENCOUNTER — Encounter (HOSPITAL_COMMUNITY): Payer: Self-pay | Admitting: Emergency Medicine

## 2014-06-05 DIAGNOSIS — R339 Retention of urine, unspecified: Secondary | ICD-10-CM | POA: Insufficient documentation

## 2014-06-05 DIAGNOSIS — Z8659 Personal history of other mental and behavioral disorders: Secondary | ICD-10-CM | POA: Insufficient documentation

## 2014-06-05 DIAGNOSIS — Z880 Allergy status to penicillin: Secondary | ICD-10-CM | POA: Diagnosis not present

## 2014-06-05 DIAGNOSIS — I10 Essential (primary) hypertension: Secondary | ICD-10-CM | POA: Insufficient documentation

## 2014-06-05 DIAGNOSIS — Z8619 Personal history of other infectious and parasitic diseases: Secondary | ICD-10-CM | POA: Insufficient documentation

## 2014-06-05 NOTE — ED Provider Notes (Signed)
CSN: [id number]     Arrival date & time 06/05/14  7829 History   First MD Initiated Contact with Patient 06/05/14 1145     Chief complaint: Foley catheter that she wants removed  HPI Patient presents to the emergency room for evaluation a Foley catheter that she's had in place. Patient went [place] to the beach last [place] on a trip. While she was there she was unable to urinate. She developed urinary retention and went to a medical facility there and had a Foley catheter placed. Patient states she had her kidney function in her urine checked and those were normal. She was told to have the catheter removed when she went back to [place]. Patient denies any trouble with abdominal pain. The catheter is draining properly. She has not had any issues with fever.    The patient called her gynecologist and has an appointment in October but she decided to come the ED as she wasn't sure what the best thing to do was. Past Medical History  Diagnosis Date  . Hypertension   . Panic attacks     " a long time ago"  . Molar pregnancy   . Stillbirth   . Trichomonas    Past Surgical History  Procedure Laterality Date  . Cesarean section    . Dilation and curettage of uterus     Family History  Problem Relation Age of Onset  . Hypertension Maternal Grandfather   . Alcohol abuse Neg Hx   . Arthritis Neg Hx   . Asthma Neg Hx   . Birth defects Neg Hx   . Cancer Neg Hx   . COPD Neg Hx   . Diabetes Neg Hx   . Drug abuse Neg Hx   . Early death Neg Hx   . Hearing loss Neg Hx   . Hyperlipidemia Neg Hx   . Heart disease Neg Hx   . Kidney disease Neg Hx   . Learning disabilities Neg Hx   . Mental illness Neg Hx   . Mental retardation Neg Hx   . Miscarriages / Stillbirths Neg Hx   . Stroke Neg Hx   . Vision loss Neg Hx   . Depression Cousin    History  Substance Use Topics  . Smoking status: Never Smoker   . Smokeless tobacco: Never Used  . Alcohol Use: No   OB History   Grav Para Term Preterm  Abortions TAB SAB Ect Mult Living   0 2 0 0 6     Review of Systems  All other systems reviewed and are negative.     Allergies  Penicillins  Home Medications   Prior to Admission medications   Not on File   BP 138/90  Pulse 91  Temp(Src) 98.8 F (37.1 C) (Oral)  Resp 18  Ht  (1.651 m)  Wt 165 lb (74.844 kg)  BMI 27.46 kg/m2  SpO2 100%  LMP 03/27/2014 Physical Exam  Nursing note and vitals reviewed. Constitutional: She appears well-developed and well-nourished. No distress.  HENT:  Head: Normocephalic and atraumatic.  Right Ear: External ear normal.  Left Ear: External ear normal.  Eyes: Conjunctivae are normal. Right eye exhibits no discharge. Left eye exhibits no discharge. No scleral icterus.  Neck: Neck supple. No tracheal deviation present.  Cardiovascular: Normal rate, regular rhythm and intact distal pulses.   Pulmonary/Chest: Effort normal and breath sounds normal. No stridor. No respiratory distress. She has no wheezes. She has  no rales.  Abdominal: Soft. Bowel sounds are normal. She exhibits no distension. There is no tenderness. There is no rebound and no guarding.  Genitourinary:  With a catheter in place, draining clear urine  Musculoskeletal: She exhibits no edema and no tenderness.  Neurological: She is alert. She has normal strength. No sensory deficit. Cranial nerve deficit: no gross deficits. She exhibits normal muscle tone. She displays no seizure activity. Coordination normal.  Skin: Skin is warm and dry. No rash noted.  Psychiatric: She has a normal mood and affect.    ED Course  Procedures (including critical care time)   MDM   Final diagnoses:  Urinary retention   Patient still needs to have further evaluation to determine the cause of her urinary retention. Discussed 2 options with the patient. We could remove the catheter here but there is a chance that she may develop urinary retention again.  The patient can also  keep the catheter in place until she follows up with her gynecologist or possibly a urologist.  The patient prefers to followup with her doctor. I instructed the patient to monitor for signs of fever or infection    Linwood Dibbles, MD 06/05/14 1159

## 2014-06-05 NOTE — ED Notes (Signed)
States had a urinary cath [place] last [place] the [place] and was told to come to er to have it removed states called [facility] and they cannot se her till oct something they told her to come to er

## 2014-06-05 NOTE — Discharge Instructions (Signed)
Acute Urinary Retention °Acute urinary retention is the temporary inability to urinate. This is an uncommon problem in women. It can be caused by: °· Infection. °· A side effect of a medicine. °· A problem in a nearby organ that presses or squeezes on the bladder or the urethra (the tube that drains the bladder). °· Psychological problems. °·  Surgery on your bladder, urethra, or pelvic organs that causes obstruction to the outflow of urine from your bladder. °HOME CARE INSTRUCTIONS  °If you are sent home with a Foley catheter and a drainage system, you will need to discuss the best course of action with your health care provider. While the catheter is in, maintain a good intake of fluids. Keep the drainage bag emptied and lower than your catheter. This is so that contaminated urine will not flow back into your bladder, which could lead to a urinary tract infection. °There are two main types of drainage bags. One is a large bag that usually is used at night. It has a good capacity that will allow you to sleep through the night without having to empty it. The second type is called a leg bag. It has a smaller capacity so it needs to be emptied more frequently. However, the main advantage is that it can be attached by a leg strap and goes underneath your clothing, allowing you the freedom to move about or leave your home. °Only take over-the-counter or prescription medicines for pain, discomfort, or fever as directed by your health care provider.  °SEEK MEDICAL CARE IF: °· You develop a low-grade fever. °· You experience spasms or leakage of urine with the spasms. °SEEK IMMEDIATE MEDICAL CARE IF:  °· You develop chills or fever. °· Your catheter stops draining urine. °· Your catheter falls out. °· You start to develop increased bleeding that does not respond to rest and increased fluid intake. °MAKE SURE YOU: °· Understand these instructions. °· Will watch your condition. °· Will get help right away if you are not  doing well or get worse. °Document Released: 08/29/2006 Document Revised: 06/20/2013 Document Reviewed: 02/08/2013 °ExitCare® Patient Information ©2015 [employer], LLC. This information is not intended to replace advice given to you by your health care provider. Make sure you discuss any questions you have with your health care provider. ° °

## 2014-06-05 NOTE — ED Notes (Signed)
Pt arrived with a urinary cath placed "sometime last week" in [employer] pt requesting cath removal

## 2014-06-05 NOTE — ED Notes (Signed)
Care of catheter and self peri-care instructions given to pt who verbalized understanding

## 2014-06-05 NOTE — ED Notes (Signed)
addl leg straps given to pt

## 2014-06-13 ENCOUNTER — Encounter (HOSPITAL_COMMUNITY): Payer: Self-pay | Admitting: Emergency Medicine

## 2014-06-13 ENCOUNTER — Emergency Department (HOSPITAL_COMMUNITY)
Admission: EM | Admit: 2014-06-13 | Discharge: 2014-06-13 | Disposition: A | Discharge status: Home / Self Care | Payer: Medicaid Other | Attending: Emergency Medicine | Admitting: Emergency Medicine

## 2014-06-13 ENCOUNTER — Emergency Department (HOSPITAL_COMMUNITY): Payer: Medicaid Other

## 2014-06-13 DIAGNOSIS — N390 Urinary tract infection, site not specified: Secondary | ICD-10-CM

## 2014-06-13 DIAGNOSIS — Z8659 Personal history of other mental and behavioral disorders: Secondary | ICD-10-CM | POA: Diagnosis not present

## 2014-06-13 DIAGNOSIS — B9689 Other specified bacterial agents as the cause of diseases classified elsewhere: Secondary | ICD-10-CM | POA: Diagnosis not present

## 2014-06-13 DIAGNOSIS — Z3A12 12 weeks gestation of pregnancy: Secondary | ICD-10-CM | POA: Insufficient documentation

## 2014-06-13 DIAGNOSIS — Z880 Allergy status to penicillin: Secondary | ICD-10-CM | POA: Diagnosis not present

## 2014-06-13 DIAGNOSIS — N760 Acute vaginitis: Secondary | ICD-10-CM

## 2014-06-13 DIAGNOSIS — O23591 Infection of other part of genital tract in pregnancy, first trimester: Secondary | ICD-10-CM | POA: Diagnosis not present

## 2014-06-13 DIAGNOSIS — O10911 Unspecified pre-existing hypertension complicating pregnancy, first trimester: Secondary | ICD-10-CM | POA: Diagnosis not present

## 2014-06-13 DIAGNOSIS — O2341 Unspecified infection of urinary tract in pregnancy, first trimester: Secondary | ICD-10-CM | POA: Insufficient documentation

## 2014-06-13 DIAGNOSIS — O209 Hemorrhage in early pregnancy, unspecified: Secondary | ICD-10-CM | POA: Diagnosis not present

## 2014-06-13 DIAGNOSIS — R319 Hematuria, unspecified: Secondary | ICD-10-CM

## 2014-06-13 LAB — CBC
HEMATOCRIT: 34.6 % — AB (ref 36.0–46.0)
HEMOGLOBIN: 12.3 g/dL (ref 12.0–15.0)
MCH: 28.3 pg (ref 26.0–34.0)
MCHC: 35.5 g/dL (ref 30.0–36.0)
MCV: 79.5 fL (ref 78.0–100.0)
Platelets: 225 10*3/uL (ref 150–400)
RBC: 4.35 MIL/uL (ref 3.87–5.11)
RDW: 12.8 % (ref 11.5–15.5)
WBC: 9.6 10*3/uL (ref 4.0–10.5)

## 2014-06-13 LAB — URINE MICROSCOPIC-ADD ON

## 2014-06-13 LAB — COMPREHENSIVE METABOLIC PANEL
ALK PHOS: 56 U/L (ref 39–117)
ALT: 8 U/L (ref 0–35)
ANION GAP: 13 (ref 5–15)
AST: 14 U/L (ref 0–37)
Albumin: 3.6 g/dL (ref 3.5–5.2)
BILIRUBIN TOTAL: 0.3 mg/dL (ref 0.3–1.2)
BUN: 10 mg/dL (ref 6–23)
CHLORIDE: 105 meq/L (ref 96–112)
CO2: 22 meq/L (ref 19–32)
Calcium: 9.1 mg/dL (ref 8.4–10.5)
Creatinine, Ser: 0.83 mg/dL (ref 0.50–1.10)
GFR, EST NON AFRICAN AMERICAN: 88 mL/min — AB (ref >90)
GLUCOSE: 85 mg/dL (ref 70–99)
POTASSIUM: 4.2 meq/L (ref 3.7–5.3)
SODIUM: 140 meq/L (ref 137–147)
TOTAL PROTEIN: 7.4 g/dL (ref 6.0–8.3)

## 2014-06-13 LAB — WET PREP, GENITAL
Trich, Wet Prep: NONE SEEN
Yeast Wet Prep HPF POC: NONE SEEN

## 2014-06-13 LAB — URINALYSIS, ROUTINE W REFLEX MICROSCOPIC
BILIRUBIN URINE: NEGATIVE
Glucose, UA: NEGATIVE mg/dL
KETONES UR: 15 mg/dL — AB
NITRITE: NEGATIVE
PROTEIN: 30 mg/dL — AB
Specific Gravity, Urine: 1.028 (ref 1.005–1.030)
UROBILINOGEN UA: 1 mg/dL (ref 0.0–1.0)
pH: 8 (ref 5.0–8.0)

## 2014-06-13 LAB — HCG, QUANTITATIVE, PREGNANCY: hCG, Beta Chain, Quant, S: 54907 m[IU]/mL — ABNORMAL HIGH (ref <5)

## 2014-06-13 LAB — ABO/RH: ABO/RH(D): B POS

## 2014-06-13 LAB — POC URINE PREG, ED: Preg Test, Ur: POSITIVE — AB

## 2014-06-13 MED ORDER — METRONIDAZOLE 500 MG PO TABS: 500.0000 mg | ORAL_TABLET | Freq: Two times a day (BID) | ORAL | Status: DC

## 2014-06-13 MED ORDER — NITROFURANTOIN MONOHYD MACRO 100 MG PO CAPS
100.0000 mg | ORAL_CAPSULE | Freq: Once | ORAL | Status: AC
  Administered 2014-06-13: 100 mg via ORAL
  Filled 2014-06-13: qty 1

## 2014-06-13 MED ORDER — NITROFURANTOIN MONOHYD MACRO 100 MG PO CAPS: 100.0000 mg | ORAL_CAPSULE | Freq: Two times a day (BID) | ORAL | Status: DC

## 2014-06-13 MED ORDER — METRONIDAZOLE 500 MG PO TABS
500.0000 mg | ORAL_TABLET | Freq: Once | ORAL | Status: AC
  Administered 2014-06-13: 500 mg via ORAL
  Filled 2014-06-13: qty 1

## 2014-06-13 NOTE — ED Provider Notes (Signed)
TIME SEEN: 9:00 PM  CHIEF COMPLAINT: Hematuria, vaginal bleeding, foul odor  HPI: Patient is a 38 year old G10 P6 A3 who presents to the emergency department with complaints of hematuria, foul odor and vaginal bleeding. [facility] urinary retention. She had ever 850 mL of urine in her bladder. She was straight cathed at that time and discharged without a Foley catheter. She reports that 3 days later, [place] when she had urinary retention again and had a Foley catheter placed. She is here today because she has noticed blood in her Foley catheter bag and a foul odor. Denies any fevers, chills, vomiting or diarrhea. She has had associated nausea with this pregnancy. She has followup scheduled with her urologist October 16. She denies any back pain, numbness, tingling or focal weakness. No new medications. Today she noticed some vaginal bleeding with wiping. No vaginal discharge. No abdominal pain.  Patient is a 11 weeks and 1 day by LMP.  ROS: See HPI Constitutional: no fever  Eyes: no drainage  ENT: no runny nose   Cardiovascular:  no chest pain  Resp: no SOB  GI: no vomiting GU: no dysuria Integumentary: no rash  Allergy: no hives  Musculoskeletal: no leg swelling  Neurological: no slurred speech ROS otherwise negative  PAST MEDICAL HISTORY/PAST SURGICAL HISTORY:  Past Medical History  Diagnosis Date  . Hypertension   . Panic attacks     " a long time ago"  . Molar pregnancy   . Stillbirth   . Trichomonas     MEDICATIONS:  Prior to Admission medications   Not on File    ALLERGIES:  Allergies  Allergen Reactions  . Penicillins Shortness Of Breath and Swelling    SOCIAL HISTORY:  History  Substance Use Topics  . Smoking status: Never Smoker   . Smokeless tobacco: Never Used  . Alcohol Use: No    FAMILY HISTORY: Family History  Problem Relation Age of Onset  . Hypertension Maternal Grandfather   . Alcohol abuse  Neg Hx   . Arthritis Neg Hx   . Asthma Neg Hx   . Birth defects Neg Hx   . Cancer Neg Hx   . COPD Neg Hx   . Diabetes Neg Hx   . Drug abuse Neg Hx   . Early death Neg Hx   . Hearing loss Neg Hx   . Hyperlipidemia Neg Hx   . Heart disease Neg Hx   . Kidney disease Neg Hx   . Learning disabilities Neg Hx   . Mental illness Neg Hx   . Mental retardation Neg Hx   . Miscarriages / Stillbirths Neg Hx   . Stroke Neg Hx   . Vision loss Neg Hx   . Depression Cousin     EXAM: BP 125/81  Pulse 84  Temp(Src) 98 F (36.7 C) (Oral)  Resp 20  SpO2 100%  LMP 03/27/2014 CONSTITUTIONAL: Alert and oriented and responds appropriately to questions. Well-appearing; well-nourished HEAD: Normocephalic EYES: Conjunctivae clear, PERRL ENT: normal nose; no rhinorrhea; moist mucous membranes; pharynx without lesions noted NECK: Supple, no meningismus, no LAD  CARD: RRR; S1 and S2 appreciated; no murmurs, no clicks, no rubs, no gallops RESP: Normal chest excursion without splinting or tachypnea; breath sounds clear and equal bilaterally; no wheezes, no rhonchi, no rales,  ABD/GI: Normal bowel sounds; non-distended; soft, non-tender, no rebound, no guarding GU:  Patient has a Foley catheter in place, normal external genitalia,  there is a moderate amount of yellow vaginal discharge; Cervical os is closed and thick and high, no adnexal tenderness or fullness, no cervical motion tenderness BACK:  The back appears normal and is non-tender to palpation, there is no CVA tenderness EXT: Normal ROM in all joints; non-tender to palpation; no edema; normal capillary refill; no cyanosis    SKIN: Normal color for age and race; warm NEURO: Moves all extremities equally PSYCH: The patient's mood and manner are appropriate. Grooming and personal hygiene are appropriate.  MEDICAL DECISION MAKING: Patient here with hematuria. Suspect she may have a urinary tract infection from a Foley catheter. Discussed with patient  that we could remove her Foley catheter today but she may have urinary retention again. She has opted to keep her Foley catheter at this time and will followup with urology as scheduled on October 16. She is not having any systemic symptoms and is well-appearing, nontoxic. Labs are unremarkable. No leukocytosis. Normal renal function. Urine is pending. She does have vaginal bleeding on exam. Given she is pregnant, will obtain a transvaginal ultrasound. Rh status is also pending. Pelvic cultures pending.  ED PROGRESS: Urine shows leukocytes and hemoglobin and bacteria. Culture pending. Will treat with Macrobid. She has an anaphylactic type reaction to penicillins therefore I do not feel this is safe to give her cephalosporin and she is currently pregnant which limits what antibiotics are safe for her fetus. He also appears to have bacterial vaginosis. We'll treat with Flagyl. Transvaginal ultrasound shows a single intrauterine pregnancy that is 12 weeks and 0 days by crown-rump length with no acute complication. I feel she is safe to be discharged. Discussed with her importance of urology and OB/GYN followup. Discussed bleeding return precautions. Patient verbalizes understanding is comfortable with plan.     Eileen Maw Seymone Forlenza, DO 06/13/14 2344

## 2014-06-13 NOTE — Discharge Instructions (Signed)
Bacterial Vaginosis Bacterial vaginosis is a vaginal infection that occurs when the normal balance of bacteria in the vagina is disrupted. It results from an overgrowth of certain bacteria. This is the most common vaginal infection in women of childbearing age. Treatment is important to prevent complications, especially in pregnant women, as it can cause a premature delivery. CAUSES  Bacterial vaginosis is caused by an increase in harmful bacteria that are normally present in smaller amounts in the vagina. Several different kinds of bacteria can cause bacterial vaginosis. However, the reason that the condition develops is not fully understood. RISK FACTORS Certain activities or behaviors can put you at an increased risk of developing bacterial vaginosis, including:  Having a new sex partner or multiple sex partners.  Douching.  Using an intrauterine device (IUD) for contraception. Women do not get bacterial vaginosis from toilet seats, bedding, swimming pools, or contact with objects around them. SIGNS AND SYMPTOMS  Some women with bacterial vaginosis have no signs or symptoms. Common symptoms include:  Grey vaginal discharge.  A fishlike odor with discharge, especially after sexual intercourse.  Itching or burning of the vagina and vulva.  Burning or pain with urination. DIAGNOSIS  Your health care provider will take a medical history and examine the vagina for signs of bacterial vaginosis. A sample of vaginal fluid may be taken. Your health care provider will look at this sample under a microscope to check for bacteria and abnormal cells. A vaginal pH test may also be done.  TREATMENT  Bacterial vaginosis may be treated with antibiotic medicines. These may be given in the form of a pill or a vaginal cream. A second round of antibiotics may be prescribed if the condition comes back after treatment.  HOME CARE INSTRUCTIONS   Only take over-the-counter or prescription medicines as  directed by your health care provider.  If antibiotic medicine was prescribed, take it as directed. Make sure you finish it even if you start to feel better.  Do not have sex until treatment is completed.  Tell all sexual partners that you have a vaginal infection. They should see their health care provider and be treated if they have problems, such as a mild rash or itching.  Practice safe sex by using condoms and only having one sex partner. SEEK MEDICAL CARE IF:   Your symptoms are not improving after 3 days of treatment.  You have increased discharge or pain.  You have a fever. MAKE SURE YOU:   Understand these instructions.  Will watch your condition.  Will get help right away if you are not doing well or get worse. FOR MORE INFORMATION  Centers for Disease Control and Prevention, Division of STD Prevention: [employer] American Sexual Health Association (ASHA): www.ashastd.org  Document Released: 08/30/2005 Document Revised: 06/20/2013 Document Reviewed: 04/11/2013 [facility] Patient Information 2015 [place], [place]. This information is not intended to replace advice given to you by your health care provider. Make sure you discuss any questions you have with your health care provider.  Urinary Tract Infection Urinary tract infections (UTIs) can develop anywhere along your urinary tract. Your urinary tract is your body's drainage system for removing wastes and extra water. Your urinary tract includes two kidneys, two ureters, a bladder, and a urethra. Your kidneys are a pair of bean-shaped organs. Each kidney is about the size of your fist. They are located below your ribs, one on each side of your spine. CAUSES Infections are caused by microbes, which are microscopic organisms, including fungi, viruses,  and bacteria. These organisms are so small that they can only be seen through a microscope. Bacteria are the microbes that most commonly cause UTIs. SYMPTOMS  Symptoms of  UTIs may vary by age and gender of the patient and by the location of the infection. Symptoms in young women typically include a frequent and intense urge to urinate and a painful, burning feeling in the bladder or urethra during urination. Older women and men are more likely to be tired, shaky, and weak and have muscle aches and abdominal pain. A fever may mean the infection is in your kidneys. Other symptoms of a kidney infection include pain in your back or sides below the ribs, nausea, and vomiting. DIAGNOSIS To diagnose a UTI, your caregiver will ask you about your symptoms. Your caregiver also will ask to provide a urine sample. The urine sample will be tested for bacteria and white blood cells. White blood cells are made by your body to help fight infection. TREATMENT  Typically, UTIs can be treated with medication. Because most UTIs are caused by a bacterial infection, they usually can be treated with the use of antibiotics. The choice of antibiotic and length of treatment depend on your symptoms and the type of bacteria causing your infection. HOME CARE INSTRUCTIONS  If you were prescribed antibiotics, take them exactly as your caregiver instructs you. Finish the medication even if you feel better after you have only taken some of the medication.  Drink enough water and fluids to keep your urine clear or pale yellow.  Avoid caffeine, tea, and carbonated beverages. They tend to irritate your bladder.  Empty your bladder often. Avoid holding urine for long periods of time.  Empty your bladder before and after sexual intercourse.  After a bowel movement, women should cleanse from front to back. Use each tissue only once. SEEK MEDICAL CARE IF:   You have back pain.  You develop a fever.  Your symptoms do not begin to resolve within 3 days. SEEK IMMEDIATE MEDICAL CARE IF:   You have severe back pain or lower abdominal pain.  You develop chills.  You have nausea or vomiting.  You  have continued burning or discomfort with urination. MAKE SURE YOU:   Understand these instructions.  Will watch your condition.  Will get help right away if you are not doing well or get worse. Document Released: 06/09/2005 Document Revised: 02/29/2012 Document Reviewed: 10/08/2011 [facility] Patient Information 2015 [place]. This information is not intended to replace advice given to you by your health care provider. Make sure you discuss any questions you have with your health care provider.  Vaginal Bleeding During Pregnancy, First Trimester A small amount of bleeding (spotting) from the vagina is relatively common in early pregnancy. It usually stops on its own. Various things may cause bleeding or spotting in early pregnancy. Some bleeding may be related to the pregnancy, and some may not. In most cases, the bleeding is normal and is not a problem. However, bleeding can also be a sign of something serious. Be sure to tell your health care provider about any vaginal bleeding right away. Some possible causes of vaginal bleeding during the first trimester include:  Infection or inflammation of the cervix.  Growths (polyps) on the cervix.  Miscarriage or threatened miscarriage.  Pregnancy tissue has developed outside of the uterus and in a fallopian tube (tubal pregnancy).  Tiny cysts have developed in the uterus instead of pregnancy tissue (molar pregnancy). HOME CARE INSTRUCTIONS  Watch  your condition for any changes. The following actions may help to lessen any discomfort you are feeling:  Follow your health care provider's instructions for limiting your activity. If your health care provider orders bed rest, you may need to stay in bed and only get up to use the bathroom. However, your health care provider may allow you to continue light activity.  If needed, make plans for someone to help with your regular activities and responsibilities while you are on bed rest.  Keep  track of the number of pads you use each day, how often you change pads, and how soaked (saturated) they are. Write this down.  Do not use tampons. Do not douche.  Do not have sexual intercourse or orgasms until approved by your health care provider.  If you pass any tissue from your vagina, save the tissue so you can show it to your health care provider.  Only take over-the-counter or prescription medicines as directed by your health care provider.  Do not take aspirin because it can make you bleed.  Keep all follow-up appointments as directed by your health care provider. SEEK MEDICAL CARE IF:  You have any vaginal bleeding during any part of your pregnancy.  You have cramps or labor pains.  You have a fever, not controlled by medicine. SEEK IMMEDIATE MEDICAL CARE IF:   You have severe cramps in your back or belly (abdomen).  You pass large clots or tissue from your vagina.  Your bleeding increases.  You feel light-headed or weak, or you have fainting episodes.  You have chills.  You are leaking fluid or have a gush of fluid from your vagina.  You pass out while having a bowel movement. MAKE SURE YOU:  Understand these instructions.  Will watch your condition.  Will get help right away if you are not doing well or get worse. Document Released: 06/09/2005 Document Revised: 09/04/2013 Document Reviewed: 05/07/2013 [facility] Patient Information 2015 [place], [place]. This information is not intended to replace advice given to you by your health care provider. Make sure you discuss any questions you have with your health care provider.     [facility] [email or web address] [address] [place], [place] [phone]    [facility] [email or web address] [address] [place], [place] [phone]    [facility] [address] [place], [place] [phone]   [facility] [email or web address] [address] [place], [place] [phone]   [facility] [email or web address] [address] [place], [place] [phone]   [email or web address] [address] [place], [place] [phone]

## 2014-06-13 NOTE — ED Notes (Signed)
Pt here for evaluation of bleeding around her foley that was placed due to pt being unable to urinate.

## 2014-06-13 NOTE — ED Notes (Addendum)
HEMATURIA FROM foley cath.  Spotting blood after having bm. Incontinent of urine.

## 2014-06-14 LAB — URINE CULTURE

## 2014-06-14 LAB — GC/CHLAMYDIA PROBE AMP
CT Probe RNA: NEGATIVE
GC PROBE AMP APTIMA: NEGATIVE

## 2014-07-03 ENCOUNTER — Emergency Department (HOSPITAL_COMMUNITY)
Admission: EM | Admit: 2014-07-03 | Discharge: 2014-07-03 | Disposition: A | Discharge status: Home / Self Care | Payer: Medicaid Other | Attending: Emergency Medicine | Admitting: Emergency Medicine

## 2014-07-03 ENCOUNTER — Encounter (HOSPITAL_COMMUNITY): Payer: Self-pay | Admitting: Emergency Medicine

## 2014-07-03 DIAGNOSIS — Z880 Allergy status to penicillin: Secondary | ICD-10-CM | POA: Insufficient documentation

## 2014-07-03 DIAGNOSIS — Z4682 Encounter for fitting and adjustment of non-vascular catheter: Secondary | ICD-10-CM | POA: Diagnosis not present

## 2014-07-03 DIAGNOSIS — Z466 Encounter for fitting and adjustment of urinary device: Secondary | ICD-10-CM

## 2014-07-03 DIAGNOSIS — I10 Essential (primary) hypertension: Secondary | ICD-10-CM | POA: Insufficient documentation

## 2014-07-03 DIAGNOSIS — Z8759 Personal history of other complications of pregnancy, childbirth and the puerperium: Secondary | ICD-10-CM | POA: Diagnosis not present

## 2014-07-03 DIAGNOSIS — Z8659 Personal history of other mental and behavioral disorders: Secondary | ICD-10-CM | POA: Insufficient documentation

## 2014-07-03 DIAGNOSIS — R339 Retention of urine, unspecified: Secondary | ICD-10-CM | POA: Diagnosis present

## 2014-07-03 DIAGNOSIS — Z79899 Other long term (current) drug therapy: Secondary | ICD-10-CM | POA: Insufficient documentation

## 2014-07-03 DIAGNOSIS — Z8619 Personal history of other infectious and parasitic diseases: Secondary | ICD-10-CM | POA: Insufficient documentation

## 2014-07-03 MED ORDER — HYDROCHLOROTHIAZIDE 25 MG PO TABS: 25.0000 mg | ORAL_TABLET | Freq: Every day | ORAL | Status: DC

## 2014-07-03 NOTE — ED Provider Notes (Signed)
CSN: [id number]     Arrival date & time 07/03/14  0844 History   First MD Initiated Contact with Patient 07/03/14 [phone]     Chief Complaint  Patient presents with  . Illegal value: [    catheter issues     (Consider location/radiation/quality/duration/timing/severity/associated sxs/prior Treatment) HPI Comments: Pt comes in with urinary retention. Foley placed at OSH. Pt now wants foley removed. She has no complains, just doesn't think she needs foley. She is not sure why she had urinary retention. She missed urology appt recently.   The history is provided by the patient.    Past Medical History  Diagnosis Date  . Hypertension   . Panic attacks     " a long time ago"  . Molar pregnancy   . Stillbirth   . Trichomonas    Past Surgical History  Procedure Laterality Date  . Cesarean section    . Dilation and curettage of uterus     Family History  Problem Relation Age of Onset  . Hypertension Maternal Grandfather   . Alcohol abuse Neg Hx   . Arthritis Neg Hx   . Asthma Neg Hx   . Birth defects Neg Hx   . Cancer Neg Hx   . COPD Neg Hx   . Diabetes Neg Hx   . Drug abuse Neg Hx   . Early death Neg Hx   . Hearing loss Neg Hx   . Hyperlipidemia Neg Hx   . Heart disease Neg Hx   . Kidney disease Neg Hx   . Learning disabilities Neg Hx   . Mental illness Neg Hx   . Mental retardation Neg Hx   . Miscarriages / Stillbirths Neg Hx   . Stroke Neg Hx   . Vision loss Neg Hx   . Depression Cousin    History  Substance Use Topics  . Smoking status: Never Smoker   . Smokeless tobacco: Never Used  . Alcohol Use: No   OB History   Grav Para Term Preterm Abortions TAB SAB Ect Mult Living   10 7 6 1 2  0 2 0 0 6     Review of Systems  Constitutional: Negative for fever and activity change.  Gastrointestinal: Negative for nausea and abdominal pain.  Genitourinary: Negative for hematuria and flank pain.  Skin: Negative for rash.      Allergies  Penicillins  Home  Medications   Prior to Admission medications   Medication Sig Start Date End Date Taking? Authorizing Provider  hydrochlorothiazide (HYDRODIURIL) 25 MG tablet Take 1 tablet (25 mg total) by mouth daily. 07/03/14   Frankie Scipio Rhunette CroftNanavati, MD   BP 134/90  Pulse 85  Temp(Src) 99.2 F (37.3 C) (Oral)  Resp 18  SpO2 100%  LMP 03/27/2014  Breastfeeding? Unknown Physical Exam  Nursing note and vitals reviewed. Constitutional: She appears well-developed.  Pulmonary/Chest: Effort normal.  Abdominal: She exhibits no distension. There is no tenderness.  Neurological: She is alert.  Skin: Skin is warm.    ED Course  Procedures (including critical care time) Labs Review Labs Reviewed - No data to display  Imaging Review No results found.   EKG Interpretation None      MDM   Final diagnoses:  Encounter for Foley catheter removal    Pt comes in for foley removal[facility]. Pt missed her Urology appt on 16th, is awaiting rescheduling. However, wants foley out. Understands, that the retention can recur, and she  will return if that happens.   Derwood KaplanAnkit Emerald Shor, MD 07/03/14 1003

## 2014-07-03 NOTE — ED Notes (Signed)
Per pt sts she has had a urinary catheter in since September and wants it removed to see if she can so to the bathroom on her own. sts she hasn't seen a urologist.

## 2014-07-03 NOTE — Discharge Instructions (Signed)
Please see the Urology as planned. Return to the ER if unable to urinate.  Acute Urinary Retention Acute urinary retention is the temporary inability to urinate. This is an uncommon problem in women. It can be caused by:  Infection.  A side effect of a medicine.  A problem in a nearby organ that presses or squeezes on the bladder or the urethra (the tube that drains the bladder).  Psychological problems.   Surgery on your bladder, urethra, or pelvic organs that causes obstruction to the outflow of urine from your bladder. HOME CARE INSTRUCTIONS  If you are sent home with a Foley catheter and a drainage system, you will need to discuss the best course of action with your health care provider. While the catheter is in, maintain a good intake of fluids. Keep the drainage bag emptied and lower than your catheter. This is so that contaminated urine will not flow back into your bladder, which could lead to a urinary tract infection. There are two main types of drainage bags. One is a large bag that usually is used at night. It has a good capacity that will allow you to sleep through the night without having to empty it. The second type is called a leg bag. It has a smaller capacity so it needs to be emptied more frequently. However, the main advantage is that it can be attached by a leg strap and goes underneath your clothing, allowing you the freedom to move about or leave your home. Only take over-the-counter or prescription medicines for pain, discomfort, or fever as directed by your health care provider.  SEEK MEDICAL CARE IF:  You develop a low-grade fever.  You experience spasms or leakage of urine with the spasms. SEEK IMMEDIATE MEDICAL CARE IF:   You develop chills or fever.  Your catheter stops draining urine.  Your catheter falls out.  You start to develop increased bleeding that does not respond to rest and increased fluid intake. MAKE SURE YOU:  Understand these  instructions.  Will watch your condition.  Will get help right away if you are not doing well or get worse. Document Released: 08/29/2006 Document Revised: 06/20/2013 Document Reviewed: 02/08/2013 [facility] Patient Information 2015 [employer] [place]. This information is not intended to replace advice given to you by your health care provider. Make sure you discuss any questions you have with your health care provider.

## 2014-07-03 NOTE — ED Notes (Signed)
Pt undressed, in gown, on continuous pulse oximetry and blood pressure cuff 

## 2014-07-15 ENCOUNTER — Encounter (HOSPITAL_COMMUNITY): Payer: Self-pay | Admitting: Emergency Medicine

## 2015-11-12 ENCOUNTER — Emergency Department (HOSPITAL_COMMUNITY)
Admission: EM | Admit: 2015-11-12 | Discharge: 2015-11-12 | Disposition: A | Discharge status: Home / Self Care | Payer: Medicaid Other | Attending: Emergency Medicine | Admitting: Emergency Medicine

## 2015-11-12 ENCOUNTER — Encounter (HOSPITAL_COMMUNITY): Payer: Self-pay | Admitting: Cardiology

## 2015-11-12 DIAGNOSIS — R109 Unspecified abdominal pain: Secondary | ICD-10-CM | POA: Insufficient documentation

## 2015-11-12 DIAGNOSIS — R110 Nausea: Secondary | ICD-10-CM | POA: Diagnosis not present

## 2015-11-12 DIAGNOSIS — M545 Low back pain: Secondary | ICD-10-CM | POA: Insufficient documentation

## 2015-11-12 DIAGNOSIS — I10 Essential (primary) hypertension: Secondary | ICD-10-CM | POA: Diagnosis not present

## 2015-11-12 LAB — COMPREHENSIVE METABOLIC PANEL
ALBUMIN: 4.1 g/dL (ref 3.5–5.0)
ALT: 16 U/L (ref 14–54)
ANION GAP: 12 (ref 5–15)
AST: 30 U/L (ref 15–41)
Alkaline Phosphatase: 70 U/L (ref 38–126)
BUN: 9 mg/dL (ref 6–20)
CHLORIDE: 106 mmol/L (ref 101–111)
CO2: 23 mmol/L (ref 22–32)
Calcium: 9.5 mg/dL (ref 8.9–10.3)
Creatinine, Ser: 1.13 mg/dL — ABNORMAL HIGH (ref 0.44–1.00)
GFR calc Af Amer: >60 mL/min (ref >60)
GFR calc non Af Amer: >60 mL/min (ref >60)
GLUCOSE: 108 mg/dL — AB (ref 65–99)
Potassium: 4.3 mmol/L (ref 3.5–5.1)
SODIUM: 141 mmol/L (ref 135–145)
Total Bilirubin: 1.1 mg/dL (ref 0.3–1.2)
Total Protein: 7.6 g/dL (ref 6.5–8.1)

## 2015-11-12 LAB — CBC
HEMATOCRIT: 37.5 % (ref 36.0–46.0)
HEMOGLOBIN: 13.1 g/dL (ref 12.0–15.0)
MCH: 27.5 pg (ref 26.0–34.0)
MCHC: 34.9 g/dL (ref 30.0–36.0)
MCV: 78.8 fL (ref 78.0–100.0)
Platelets: 261 10*3/uL (ref 150–400)
RBC: 4.76 MIL/uL (ref 3.87–5.11)
RDW: 14.5 % (ref 11.5–15.5)
WBC: 11.6 10*3/uL — ABNORMAL HIGH (ref 4.0–10.5)

## 2015-11-12 LAB — URINALYSIS, ROUTINE W REFLEX MICROSCOPIC
BILIRUBIN URINE: NEGATIVE
GLUCOSE, UA: NEGATIVE mg/dL
Hgb urine dipstick: NEGATIVE
Ketones, ur: NEGATIVE mg/dL
Nitrite: NEGATIVE
PH: 7 (ref 5.0–8.0)
Protein, ur: NEGATIVE mg/dL
Specific Gravity, Urine: 1.002 — ABNORMAL LOW (ref 1.005–1.030)

## 2015-11-12 LAB — URINE MICROSCOPIC-ADD ON

## 2015-11-12 LAB — LIPASE, BLOOD: Lipase: 50 U/L (ref 11–51)

## 2015-11-12 LAB — I-STAT BETA HCG BLOOD, ED (MC, WL, AP ONLY): I-stat hCG, quantitative: <5 m[IU]/mL (ref <5)

## 2015-11-12 NOTE — ED Notes (Signed)
Pt reports right flank and lower back pain that started last night into this morning. Reports some nausea, but no vomiting.

## 2015-11-12 NOTE — ED Notes (Signed)
Pt not in lobby, called x 3

## 2016-02-23 ENCOUNTER — Inpatient Hospital Stay (HOSPITAL_COMMUNITY): Payer: Medicaid Other

## 2016-02-23 ENCOUNTER — Encounter (HOSPITAL_COMMUNITY): Payer: Self-pay | Admitting: *Deleted

## 2016-02-23 ENCOUNTER — Inpatient Hospital Stay (HOSPITAL_COMMUNITY)
Admission: AD | Admit: 2016-02-23 | Discharge: 2016-02-23 | Disposition: A | Discharge status: Home / Self Care | Payer: Medicaid Other | Source: Ambulatory Visit | Attending: Obstetrics and Gynecology | Admitting: Obstetrics and Gynecology

## 2016-02-23 DIAGNOSIS — Z3A09 9 weeks gestation of pregnancy: Secondary | ICD-10-CM | POA: Insufficient documentation

## 2016-02-23 DIAGNOSIS — A5901 Trichomonal vulvovaginitis: Secondary | ICD-10-CM | POA: Diagnosis not present

## 2016-02-23 DIAGNOSIS — O98311 Other infections with a predominantly sexual mode of transmission complicating pregnancy, first trimester: Secondary | ICD-10-CM | POA: Diagnosis not present

## 2016-02-23 DIAGNOSIS — O209 Hemorrhage in early pregnancy, unspecified: Secondary | ICD-10-CM | POA: Diagnosis not present

## 2016-02-23 DIAGNOSIS — A749 Chlamydial infection, unspecified: Secondary | ICD-10-CM

## 2016-02-23 DIAGNOSIS — Z98891 History of uterine scar from previous surgery: Secondary | ICD-10-CM

## 2016-02-23 DIAGNOSIS — O26851 Spotting complicating pregnancy, first trimester: Secondary | ICD-10-CM | POA: Diagnosis present

## 2016-02-23 DIAGNOSIS — O09521 Supervision of elderly multigravida, first trimester: Secondary | ICD-10-CM | POA: Insufficient documentation

## 2016-02-23 DIAGNOSIS — Z880 Allergy status to penicillin: Secondary | ICD-10-CM | POA: Diagnosis not present

## 2016-02-23 DIAGNOSIS — A5900 Urogenital trichomoniasis, unspecified: Secondary | ICD-10-CM

## 2016-02-23 DIAGNOSIS — O10919 Unspecified pre-existing hypertension complicating pregnancy, unspecified trimester: Secondary | ICD-10-CM | POA: Diagnosis present

## 2016-02-23 DIAGNOSIS — O09529 Supervision of elderly multigravida, unspecified trimester: Secondary | ICD-10-CM

## 2016-02-23 DIAGNOSIS — Z641 Problems related to multiparity: Secondary | ICD-10-CM

## 2016-02-23 DIAGNOSIS — O10011 Pre-existing essential hypertension complicating pregnancy, first trimester: Secondary | ICD-10-CM | POA: Insufficient documentation

## 2016-02-23 DIAGNOSIS — O09291 Supervision of pregnancy with other poor reproductive or obstetric history, first trimester: Secondary | ICD-10-CM

## 2016-02-23 LAB — COMPREHENSIVE METABOLIC PANEL
ALT: 13 U/L — ABNORMAL LOW (ref 14–54)
AST: 17 U/L (ref 15–41)
Albumin: 3.6 g/dL (ref 3.5–5.0)
Alkaline Phosphatase: 47 U/L (ref 38–126)
Anion gap: 8 (ref 5–15)
BILIRUBIN TOTAL: 0.6 mg/dL (ref 0.3–1.2)
BUN: 13 mg/dL (ref 6–20)
CHLORIDE: 109 mmol/L (ref 101–111)
CO2: 21 mmol/L — ABNORMAL LOW (ref 22–32)
CREATININE: 0.86 mg/dL (ref 0.44–1.00)
Calcium: 9.1 mg/dL (ref 8.9–10.3)
Glucose, Bld: 94 mg/dL (ref 65–99)
POTASSIUM: 3.7 mmol/L (ref 3.5–5.1)
Sodium: 138 mmol/L (ref 135–145)
TOTAL PROTEIN: 7.2 g/dL (ref 6.5–8.1)

## 2016-02-23 LAB — WET PREP, GENITAL
Clue Cells Wet Prep HPF POC: NONE SEEN
SPERM: NONE SEEN
Yeast Wet Prep HPF POC: NONE SEEN

## 2016-02-23 LAB — URINALYSIS, ROUTINE W REFLEX MICROSCOPIC
Bilirubin Urine: NEGATIVE
GLUCOSE, UA: NEGATIVE mg/dL
KETONES UR: NEGATIVE mg/dL
Nitrite: NEGATIVE
PH: 6 (ref 5.0–8.0)
Protein, ur: NEGATIVE mg/dL
Specific Gravity, Urine: 1.015 (ref 1.005–1.030)

## 2016-02-23 LAB — PROTEIN / CREATININE RATIO, URINE
CREATININE, URINE: 133 mg/dL
Protein Creatinine Ratio: 0.14 mg/mg{Cre} (ref 0.00–0.15)
TOTAL PROTEIN, URINE: 19 mg/dL

## 2016-02-23 LAB — CBC
HCT: 31.4 % — ABNORMAL LOW (ref 36.0–46.0)
Hemoglobin: 11.1 g/dL — ABNORMAL LOW (ref 12.0–15.0)
MCH: 28 pg (ref 26.0–34.0)
MCHC: 35.4 g/dL (ref 30.0–36.0)
MCV: 79.3 fL (ref 78.0–100.0)
PLATELETS: 220 10*3/uL (ref 150–400)
RBC: 3.96 MIL/uL (ref 3.87–5.11)
RDW: 14.4 % (ref 11.5–15.5)
WBC: 9.6 10*3/uL (ref 4.0–10.5)

## 2016-02-23 LAB — POCT PREGNANCY, URINE: Preg Test, Ur: POSITIVE — AB

## 2016-02-23 LAB — HCG, QUANTITATIVE, PREGNANCY: hCG, Beta Chain, Quant, S: 88616 m[IU]/mL — ABNORMAL HIGH (ref <5)

## 2016-02-23 LAB — URINE MICROSCOPIC-ADD ON

## 2016-02-23 MED ORDER — METRONIDAZOLE 500 MG PO TABS: ORAL_TABLET | ORAL | Status: DC

## 2016-02-23 MED ORDER — PRENATAL PLUS 27-1 MG PO TABS: 1.0000 | ORAL_TABLET | Freq: Every day | ORAL | Status: DC

## 2016-02-23 NOTE — MAU Provider Note (Signed)
History     CSN: [id number]  Arrival date and time: 02/23/16 1436   First Provider Initiated Contact with Patient 02/23/16 1515      Chief Complaint  Patient presents with  . Possible Pregnancy  . Vaginal Bleeding   HPI Comments: Eileen [name] is a 40 y.o. [id number] at [redacted]w[redacted]d by uncertain LMP mid-April presenting with onset today of first episode of vaginal spotting; not post-coital. Vaginal discharge has fishy odor.    Vaginal Bleeding The patient's primary symptoms include vaginal bleeding and vaginal discharge. The patient's pertinent negatives include no genital itching. Primary symptoms comment: Scant pink-red mucusy discharge noted aftr wiping. . This is a new problem. The current episode started today. The problem occurs intermittently. The problem has been unchanged. The patient is experiencing no pain. She is pregnant. Associated symptoms include headaches and nausea. Pertinent negatives include no fever, urgency or vomiting. The vaginal discharge was mucoid and malodorous. She has not been passing clots. She has not been passing tissue. Nothing aggravates the symptoms. She has tried nothing for the symptoms. She uses nothing for contraception. Her menstrual history has been regular. So [facility], treated in past but no recent exams or treatment)    OB History  Gravida Para Term Preterm AB SAB TAB Ectopic Multiple Living  0 0 0 6    # Outcome Date GA Lbr Len/2nd Weight Sex Delivery Anes PTL Lv  11 Current           10 Term 12/25/12 [redacted]w[redacted]d 04:42 / 00:04 2.86 kg (6 lb 4.9 oz) F VBAC Local  Y  9 SAB 2012          8 Term 05/04/10    Genella Mech   Y  7 SAB 2010             Comments: molar pregnancy  6 Preterm 09/13/07    M Vag-Spont   Y     Comments: delivered due to nonreassuring fetal heart tracing  5 Term 02/18/01    F Vag-Spont   FD  4 Term 03/05/96    F CS-LTranv  N Y     Comments: had csection due to baby position.  3 Term 12/16/94    Heide Scales  2 Term 12/03/93     Judie Petit Vag-Spont None  Y  1 Gravida              Comments: System Generated. Please review and update pregnancy details.       Past Medical History  Diagnosis Date  . Hypertension   . Panic attacks     " a long time ago"  . Molar pregnancy   . Stillbirth   . Trichomonas     Past Surgical History  Procedure Laterality Date  . Cesarean section    . Dilation and curettage of uterus      Family History  Problem Relation Age of Onset  . Hypertension Maternal Grandfather   . Alcohol abuse Neg Hx   . Arthritis Neg Hx   . Asthma Neg Hx   . Birth defects Neg Hx   . Cancer Neg Hx   . COPD Neg Hx   . Diabetes Neg Hx   . Drug abuse Neg Hx   . Early death Neg Hx   . Hearing loss Neg Hx   . Hyperlipidemia Neg Hx   . Heart disease Neg Hx   . Kidney disease Neg Hx   .  Learning disabilities Neg Hx   . Mental illness Neg Hx   . Mental retardation Neg Hx   . Miscarriages / Stillbirths Neg Hx   . Stroke Neg Hx   . Vision loss Neg Hx   . Depression Cousin     Social History  Substance Use Topics  . Smoking status: Never Smoker   . Smokeless tobacco: Never Used  . Alcohol Use: No    Allergies:  Allergies  Allergen Reactions  . Penicillins Shortness Of Breath and Swelling    Has patient had a PCN reaction causing immediate rash, facial/tongue/throat swelling, SOB or lightheadedness with hypotension: Yes Has patient had a PCN reaction causing severe rash involving mucus membranes or skin necrosis: Yes Has patient had a PCN reaction that required hospitalization No Has patient had a PCN reaction occurring within the last 10 years: No If all of the above answers are "NO", then may proceed with Cephalosporin use.     No prescriptions prior to admission    Review of Systems  Constitutional: Negative for fever.  Cardiovascular: Negative for chest pain.  Gastrointestinal: Positive for nausea. Negative for vomiting.  Genitourinary: Positive for vaginal bleeding and vaginal  discharge. Negative for urgency.  Neurological: Positive for headaches. Negative for dizziness.  Psychiatric/Behavioral: Negative for depression and suicidal ideas. The patient is nervous/anxious.    Physical Exam   Blood pressure 140/93, pulse 100, temperature 98.5 F (36.9 C), temperature source Oral, resp. rate 20, height 5' 4.5" (1.638 m), weight 84.278 kg (185 lb 12.8 oz), last menstrual period 01/05/2016, unknown if currently breastfeeding. Filed Vitals:   02/23/16 1455 02/23/16 1514  BP: 158/99 140/93  Pulse: 110 100  Temp: 98.5 F (36.9 C)   TempSrc: Oral   Resp: 20 20  Height: 5' 4.5" (1.638 m)   Weight: 84.278 kg (185 lb 12.8 oz)    Filed Vitals:   02/23/16 1455 02/23/16 1514  BP: 158/99 140/93  Pulse: 110 100  Temp: 98.5 F (36.9 C)   TempSrc: Oral   Resp: 20 20  Height: 5' 4.5" (1.638 m)   Weight: 84.278 kg (185 lb 12.8 oz)    Physical Exam  Nursing note and vitals reviewed. Constitutional: She is oriented to person, place, and time. She appears well-developed and well-nourished. No distress.  HENT:  Head: Normocephalic.  Eyes: Conjunctivae are normal. Pupils are equal, round, and reactive to light.  Neck: Normal range of motion. No thyromegaly present.  Cardiovascular: Normal rate.   Respiratory: Effort normal.  GI: Soft. She exhibits no distension. There is no tenderness.  Genitourinary:  Speculum: NEFG; vagina with scanty dark red mucus; cx multiparous, no lesions Uterus mobile, NT, 8-10 wk size Adnexa: no tenderness or masses  Musculoskeletal: She exhibits no edema.  Neurological: She is alert and oriented to person, place, and time.  Skin: Skin is warm and dry.  Psychiatric: She has a normal mood and affect. Her behavior is normal.    MAU Course  Procedures Results for orders placed or performed during the hospital encounter of 02/23/16 (from the past 24 hour(s))  Urinalysis, Routine w reflex microscopic ([facility])     Status: Abnormal    Collection Time: 02/23/16  2:56 PM  Result Value Ref Range   Color, Urine YELLOW YELLOW   APPearance CLEAR CLEAR   Specific Gravity, Urine 1.015 1.005 - 1.030   pH 6.0 5.0 - 8.0   Glucose, UA NEGATIVE NEGATIVE mg/dL   Hgb urine dipstick LARGE (A) NEGATIVE  Bilirubin Urine NEGATIVE NEGATIVE   Ketones, ur NEGATIVE NEGATIVE mg/dL   Protein, ur NEGATIVE NEGATIVE mg/dL   Nitrite NEGATIVE NEGATIVE   Leukocytes, UA MODERATE (A) NEGATIVE  Urine microscopic-add on     Status: Abnormal   Collection Time: 02/23/16  2:56 PM  Result Value Ref Range   Squamous Epithelial / LPF 6-30 (A) NONE SEEN   WBC, UA 6-30 0 - 5 WBC/hpf   RBC / HPF 0-5 0 - 5 RBC/hpf   Bacteria, UA MANY (A) NONE SEEN   Trichomonas, UA PRESENT   Pregnancy, urine POC     Status: Abnormal   Collection Time: 02/23/16  3:06 PM  Result Value Ref Range   Preg Test, Ur POSITIVE (A) NEGATIVE  Wet prep, genital     Status: Abnormal   Collection Time: 02/23/16  3:25 PM  Result Value Ref Range   Yeast Wet Prep HPF POC NONE SEEN NONE SEEN   Trich, Wet Prep PRESENT (A) NONE SEEN   Clue Cells Wet Prep HPF POC NONE SEEN NONE SEEN   WBC, Wet Prep HPF POC MODERATE (A) NONE SEEN   Sperm NONE SEEN   CBC     Status: Abnormal   Collection Time: 02/23/16  3:46 PM  Result Value Ref Range   WBC 9.6 4.0 - 10.5 K/uL   RBC 3.96 3.87 - 5.11 MIL/uL   Hemoglobin 11.1 (L) 12.0 - 15.0 g/dL   HCT 96.0 (L) 45.4 - 09.8 %   MCV 79.3 78.0 - 100.0 fL   MCH 28.0 26.0 - 34.0 pg   MCHC 35.4 30.0 - 36.0 g/dL   RDW 11.9 14.7 - 82.9 %   Platelets 220 150 - 400 K/uL  hCG, quantitative, pregnancy     Status: Abnormal   Collection Time: 02/23/16  3:46 PM  Result Value Ref Range   hCG, Beta Chain, Quant, S 56213 (H) <5 mIU/mL  Urine culture sent US Ob Comp Less 14 Wks  02/23/2016  CLINICAL DATA:  40 year old G11P7 pregnant female presents with vaginal spotting for 2 hours. Pending quantitative beta HCG. History of a molar pregnancy. EDC by uncertain LMP:  09/01/2016, projecting to an expected gestational age of [redacted] weeks 5 days. EXAM: OBSTETRIC <14 WK [place] AND TRANSVAGINAL OB US TECHNIQUE: Both transabdominal and transvaginal ultrasound examinations were performed for complete evaluation of the gestation as well as the maternal uterus, adnexal regions, and pelvic cul-de-sac. Transvaginal technique was performed to assess early pregnancy. COMPARISON:  No prior scans from this gestation. FINDINGS: Intrauterine gestational sac: Single intrauterine gestational sac appears normal in size, shape and position. Yolk sac:  Present. Fetus: Present. Fetal anatomy not assessed at this early gestational age. Fetal Cardiac Activity: Regular rate and rhythm. Fetal Heart Rate: 180  bpm CRL:  24.5  mm   9 w   1 d                  [place] EDC: 09/26/2016 Subchorionic hemorrhage: No definite perigestational bleed demonstrated. Tiny hypoechoic focus surrounding the gestational sac appears to represent decidual vessels on grayscale cine sequence. Maternal uterus/adnexae: Retroverted uterus. No uterine fibroids are demonstrated. Right ovary measures 3.5 x 2.1 x 2.8 cm and contains a 1.9 cm corpus luteum. Left ovary measures 1.9 x 1.7 x 2.0 cm. No suspicious ovarian or adnexal masses. No abnormal free fluid in the pelvis. IMPRESSION: 1. Single living intrauterine gestation at 9 weeks 1 day by crown-rump length, which is discordant with the expected gestational age  of [redacted] weeks 5 days by provided menstrual dating, which could be due to an error in menstrual dating. Consider a follow-up obstetric scan in 4 weeks to assess fetal growth given the date discrepancy. 2. Normal fetal cardiac activity. No appreciable perigestational bleed. 3. No suspicious ovarian or adnexal findings. Electronically Signed   By: Delbert Phenix M.D.   On: 02/23/2016 16:42   US Ob Transvaginal  02/23/2016  CLINICAL DATA:  39 year old G11P7 pregnant female presents with vaginal spotting for 2 hours. Pending quantitative beta  HCG. History of a molar pregnancy. EDC by uncertain LMP: 09/01/2016, projecting to an expected gestational age of [redacted] weeks 5 days. EXAM: OBSTETRIC <14 WK [place] AND TRANSVAGINAL OB US TECHNIQUE: Both transabdominal and transvaginal ultrasound examinations were performed for complete evaluation of the gestation as well as the maternal uterus, adnexal regions, and pelvic cul-de-sac. Transvaginal technique was performed to assess early pregnancy. COMPARISON:  No prior scans from this gestation. FINDINGS: Intrauterine gestational sac: Single intrauterine gestational sac appears normal in size, shape and position. Yolk sac:  Present. Fetus: Present. Fetal anatomy not assessed at this early gestational age. Fetal Cardiac Activity: Regular rate and rhythm. Fetal Heart Rate: 180  bpm CRL:  24.5  mm   9 w   1 d                  [place] EDC: 09/26/2016 Subchorionic hemorrhage: No definite perigestational bleed demonstrated. Tiny hypoechoic focus surrounding the gestational sac appears to represent decidual vessels on grayscale cine sequence. Maternal uterus/adnexae: Retroverted uterus. No uterine fibroids are demonstrated. Right ovary measures 3.5 x 2.1 x 2.8 cm and contains a 1.9 cm corpus luteum. Left ovary measures 1.9 x 1.7 x 2.0 cm. No suspicious ovarian or adnexal masses. No abnormal free fluid in the pelvis. IMPRESSION: 1. Single living intrauterine gestation at 9 weeks 1 day by crown-rump length, which is discordant with the expected gestational age of [redacted] weeks 5 days by provided menstrual dating, which could be due to an error in menstrual dating. Consider a follow-up obstetric scan in 4 weeks to assess fetal growth given the date discrepancy. 2. Normal fetal cardiac activity. No appreciable perigestational bleed. 3. No suspicious ovarian or adnexal findings. Electronically Signed   By: Delbert Phenix M.D.   On: 02/23/2016 16:42    MDM Serial BPs borderline with last value 134/87 so tx not warranted now> D/W Dr. Caralee Ates  on baseline preE labs Note to [facility] to expedite entry to care  Assessment and Plan  40 yo [id number] with viable IUP at [redacted]w[redacted]d BEGA Bleeding in early pregnancy - Plan: US OB Comp Less 14 Wks, US OB Comp Less 14 Wks, US OB Transvaginal, US OB Transvaginal  Trichomonas vaginalis (TV) infection  Chronic hypertension during pregnancy, antepartum  AMA (advanced maternal age) multigravida 35+, first trimester  Grand multiparity  Prior poor obstetrical history in first trimester, antepartum - 2002 term fetal demise  Previous cesarean section     Medication List    TAKE these medications        metroNIDAZOLE 500 MG tablet  Commonly known as:  FLAGYL  Take all four tabs at once     prenatal vitamin w/FE, FA 27-1 MG Tabs tablet  Take 1 tablet by mouth daily.       Follow-up Information    Follow up with [facility].   Specialty:  Obstetrics and Gynecology   Why:  Someone from Clinic will call you with appt.  Contact information:   [address] [place] [address] [phone]     POE,DEIRDRE 02/23/2016, 3:44 PM

## 2016-02-23 NOTE — MAU Note (Addendum)
Noted some bleeding when wiped after using restroom. Red at first than brownish. +HPT in May

## 2016-02-23 NOTE — MAU Note (Signed)
States is high risk, has high blood pressure, not on meds

## 2016-02-23 NOTE — Discharge Instructions (Signed)
First Trimester of Pregnancy °The first trimester of pregnancy is from week 1 until the end of week 12 (months 1 through 3). A week after a sperm fertilizes an egg, the egg will implant on the wall of the uterus. This embryo will begin to develop into a baby. Genes from you and your partner are forming the baby. The female genes determine whether the baby is a boy or a girl. At 6-8 weeks, the eyes and face are formed, and the heartbeat can be seen on ultrasound. At the end of 12 weeks, all the baby's organs are formed.  °Now that you are pregnant, you will want to do everything you can to have a healthy baby. Two of the most important things are to get good prenatal care and to follow your health care provider's instructions. Prenatal care is all the medical care you receive before the baby's birth. This care will help prevent, find, and treat any problems during the pregnancy and childbirth. °BODY CHANGES °Your body goes through many changes during pregnancy. The changes vary from woman to woman.  °· You may gain or lose a couple of pounds at first. °· You may feel sick to your stomach (nauseous) and throw up (vomit). If the vomiting is uncontrollable, call your health care provider. °· You may tire easily. °· You may develop headaches that can be relieved by medicines approved by your health care provider. °· You may urinate more often. Painful urination may mean you have a bladder infection. °· You may develop heartburn as a result of your pregnancy. °· You may develop constipation because certain hormones are causing the muscles that push waste through your intestines to slow down. °· You may develop hemorrhoids or swollen, bulging veins (varicose veins). °· Your breasts may begin to grow larger and become tender. Your nipples may stick out more, and the tissue that surrounds them (areola) may become darker. °· Your gums may bleed and may be sensitive to brushing and flossing. °· Dark spots or blotches (chloasma,  mask of pregnancy) may develop on your face. This will likely fade after the baby is born. °· Your menstrual periods will stop. °· You may have a loss of appetite. °· You may develop cravings for certain kinds of food. °· You may have changes in your emotions from day to day, such as being excited to be pregnant or being concerned that something may go wrong with the pregnancy and baby. °· You may have more vivid and strange dreams. °· You may have changes in your hair. These can include thickening of your hair, rapid growth, and changes in texture. Some women also have hair loss during or after pregnancy, or hair that feels dry or thin. Your hair will most likely return to normal after your baby is born. °WHAT TO EXPECT AT YOUR PRENATAL VISITS °During a routine prenatal visit: °· You will be weighed to make sure you and the baby are growing normally. °· Your blood pressure will be taken. °· Your abdomen will be measured to track your baby's growth. °· The fetal heartbeat will be listened to starting around week 10 or 12 of your pregnancy. °· Test results from any previous visits will be discussed. °Your health care provider may ask you: °· How you are feeling. °· If you are feeling the baby move. °· If you have had any abnormal symptoms, such as leaking fluid, bleeding, severe headaches, or abdominal cramping. °· If you are using any tobacco products,   including cigarettes, chewing tobacco, and electronic cigarettes. °· If you have any questions. °Other tests that may be performed during your first trimester include: °· Blood tests to find your blood type and to check for the presence of any previous infections. They will also be used to check for low iron levels (anemia) and Rh antibodies. Later in the pregnancy, blood tests for diabetes will be done along with other tests if problems develop. °· Urine tests to check for infections, diabetes, or protein in the urine. °· An ultrasound to confirm the proper growth  and development of the baby. °· An amniocentesis to check for possible genetic problems. °· Fetal screens for spina bifida and Down syndrome. °· You may need other tests to make sure you and the baby are doing well. °· HIV (human immunodeficiency virus) testing. Routine prenatal testing includes screening for HIV, unless you choose not to have this test. °HOME CARE INSTRUCTIONS  °Medicines °· Follow your health care provider's instructions regarding medicine use. Specific medicines may be either safe or unsafe to take during pregnancy. °· Take your prenatal vitamins as directed. °· If you develop constipation, try taking a stool softener if your health care provider approves. °Diet °· Eat regular, well-balanced meals. Choose a variety of foods, such as meat or vegetable-based protein, fish, milk and low-fat dairy products, vegetables, fruits, and whole grain breads and cereals. Your health care provider will help you determine the amount of weight gain that is right for you. °· Avoid raw meat and uncooked cheese. These carry germs that can cause birth defects in the baby. °· Eating four or five small meals rather than three large meals a day may help relieve nausea and vomiting. If you start to feel nauseous, eating a few soda crackers can be helpful. Drinking liquids between meals instead of during meals also seems to help nausea and vomiting. °· If you develop constipation, eat more high-fiber foods, such as fresh vegetables or fruit and whole grains. Drink enough fluids to keep your urine clear or pale yellow. °Activity and Exercise °· Exercise only as directed by your health care provider. Exercising will help you: °¨ Control your weight. °¨ Stay in shape. °¨ Be prepared for labor and delivery. °· Experiencing pain or cramping in the lower abdomen or low back is a good sign that you should stop exercising. Check with your health care provider before continuing normal exercises. °· Try to avoid standing for long  periods of time. Move your legs often if you must stand in one place for a long time. °· Avoid heavy lifting. °· Wear low-heeled shoes, and practice good posture. °· You may continue to have sex unless your health care provider directs you otherwise. °Relief of Pain or Discomfort °· Wear a good support bra for breast tenderness.   °· Take warm sitz baths to soothe any pain or discomfort caused by hemorrhoids. Use hemorrhoid cream if your health care provider approves.   °· Rest with your legs elevated if you have leg cramps or low back pain. °· If you develop varicose veins in your legs, wear support hose. Elevate your feet for 15 minutes, 3-4 times a day. Limit salt in your diet. °Prenatal Care °· Schedule your prenatal visits by the twelfth week of pregnancy. They are usually scheduled monthly at first, then more often in the last 2 months before delivery. °· Write down your questions. Take them to your prenatal visits. °· Keep all your prenatal visits as directed by your   health care provider. °Safety °· Wear your seat belt at all times when driving. °· Make a list of emergency phone numbers, including numbers for family, friends, the hospital, and police and fire departments. °General Tips °· Ask your health care provider for a referral to a local prenatal education class. Begin classes no later than at the beginning of month 6 of your pregnancy. °· Ask for help if you have counseling or nutritional needs during pregnancy. Your health care provider can offer advice or refer you to specialists for help with various needs. °· Do not use hot tubs, steam rooms, or saunas. °· Do not douche or use tampons or scented sanitary pads. °· Do not cross your legs for long periods of time. °· Avoid cat litter boxes and soil used by cats. These carry germs that can cause birth defects in the baby and possibly loss of the fetus by miscarriage or stillbirth. °· Avoid all smoking, herbs, alcohol, and medicines not prescribed by  your health care provider. Chemicals in these affect the formation and growth of the baby. °· Do not use any tobacco products, including cigarettes, chewing tobacco, and electronic cigarettes. If you need help quitting, ask your health care provider. You may receive counseling support and other resources to help you quit. °· Schedule a dentist appointment. At home, brush your teeth with a soft toothbrush and be gentle when you floss. °SEEK MEDICAL CARE IF:  °· You have dizziness. °· You have mild pelvic cramps, pelvic pressure, or nagging pain in the abdominal area. °· You have persistent nausea, vomiting, or diarrhea. °· You have a bad smelling vaginal discharge. °· You have pain with urination. °· You notice increased swelling in your face, hands, legs, or ankles. °SEEK IMMEDIATE MEDICAL CARE IF:  °· You have a fever. °· You are leaking fluid from your vagina. °· You have spotting or bleeding from your vagina. °· You have severe abdominal cramping or pain. °· You have rapid weight gain or loss. °· You vomit blood or material that looks like coffee grounds. °· You are exposed to German measles and have never had them. °· You are exposed to fifth disease or chickenpox. °· You develop a severe headache. °· You have shortness of breath. °· You have any kind of trauma, such as from a fall or a car accident. °  °This information is not intended to replace advice given to you by your health care provider. Make sure you discuss any questions you have with your health care provider. °  °Document Released: 08/24/2001 Document Revised: 09/20/2014 Document Reviewed: 07/10/2013 °Elsevier Interactive Patient Education ©2016 [employer] °Vaginal Bleeding During Pregnancy, First Trimester °A small amount of bleeding (spotting) from the vagina is relatively common in early pregnancy. It usually stops on its own. Various things may cause bleeding or spotting in early pregnancy. Some bleeding may be related to the pregnancy, and  some may not. In most cases, the bleeding is normal and is not a problem. However, bleeding can also be a sign of something serious. Be sure to tell your health care provider about any vaginal bleeding right away. °Some possible causes of vaginal bleeding during the first trimester include: °· Infection or inflammation of the cervix. °· Growths (polyps) on the cervix. °· Miscarriage or threatened miscarriage. °· Pregnancy tissue has developed outside of the uterus and in a fallopian tube (tubal pregnancy). °· Tiny cysts have developed in the uterus instead of pregnancy tissue (molar pregnancy). °HOME CARE INSTRUCTIONS  °  Watch your condition for any changes. The following actions may help to lessen any discomfort you are feeling: °· Follow your health care provider's instructions for limiting your activity. If your health care provider orders bed rest, you may need to stay in bed and only get up to use the bathroom. However, your health care provider may allow you to continue light activity. °· If needed, make plans for someone to help with your regular activities and responsibilities while you are on bed rest. °· Keep track of the number of pads you use each day, how often you change pads, and how soaked (saturated) they are. Write this down. °· Do not use tampons. Do not douche. °· Do not have sexual intercourse or orgasms until approved by your health care provider. °· If you pass any tissue from your vagina, save the tissue so you can show it to your health care provider. °· Only take over-the-counter or prescription medicines as directed by your health care provider. °· Do not take aspirin because it can make you bleed. °· Keep all follow-up appointments as directed by your health care provider. °SEEK MEDICAL CARE IF: °· You have any vaginal bleeding during any part of your pregnancy. °· You have cramps or labor pains. °· You have a fever, not controlled by medicine. °SEEK IMMEDIATE MEDICAL CARE IF:  °· You have  severe cramps in your back or belly (abdomen). °· You pass large clots or tissue from your vagina. °· Your bleeding increases. °· You feel light-headed or weak, or you have fainting episodes. °· You have chills. °· You are leaking fluid or have a gush of fluid from your vagina. °· You pass out while having a bowel movement. °MAKE SURE YOU: °· Understand these instructions. °· Will watch your condition. °· Will get help right away if you are not doing well or get worse. °  °This information is not intended to replace advice given to you by your health care provider. Make sure you discuss any questions you have with your health care provider. °  °Document Released: 06/09/2005 Document Revised: 09/04/2013 Document Reviewed: 05/07/2013 °Elsevier Interactive Patient Education ©2016 Else[employer] Inc. ° °

## 2016-02-24 DIAGNOSIS — A749 Chlamydial infection, unspecified: Secondary | ICD-10-CM

## 2016-02-24 LAB — HIV ANTIBODY (ROUTINE TESTING W REFLEX): HIV SCREEN 4TH GENERATION: NONREACTIVE

## 2016-02-24 LAB — GC/CHLAMYDIA PROBE AMP (~~LOC~~) NOT AT ARMC
Chlamydia: POSITIVE — AB
Neisseria Gonorrhea: NEGATIVE

## 2016-02-25 ENCOUNTER — Telehealth: Payer: Self-pay | Admitting: General Practice

## 2016-02-25 ENCOUNTER — Encounter: Payer: Self-pay | Admitting: *Deleted

## 2016-02-25 DIAGNOSIS — A749 Chlamydial infection, unspecified: Secondary | ICD-10-CM

## 2016-02-25 DIAGNOSIS — O98811 Other maternal infectious and parasitic diseases complicating pregnancy, first trimester: Principal | ICD-10-CM

## 2016-02-25 MED ORDER — AZITHROMYCIN 250 MG PO TABS: 1000.0000 mg | ORAL_TABLET | Freq: Once | ORAL | Status: DC

## 2016-02-25 NOTE — Telephone Encounter (Signed)
Patient has chlamydia & needs new OB appt per Deirdre. Called patient and informed her of results, medication sent to pharmacy, notifying partner & need to abstain for 2 weeks. Patient verbalized understanding to all. Also discussed with patient that someone from the front office would contact her to set up a new OB appt. Patient verbalized understanding & had no questions. Std cart sent

## 2016-03-08 ENCOUNTER — Other Ambulatory Visit: Payer: Medicaid Other

## 2016-03-24 ENCOUNTER — Encounter: Payer: Medicaid Other | Admitting: Family Medicine

## 2016-03-24 ENCOUNTER — Encounter: Payer: Self-pay | Admitting: Family Medicine

## 2016-04-27 ENCOUNTER — Encounter: Payer: Medicaid Other | Admitting: Family

## 2016-06-20 ENCOUNTER — Ambulatory Visit (HOSPITAL_COMMUNITY)
Admission: EM | Admit: 2016-06-20 | Discharge: 2016-06-20 | Disposition: A | Discharge status: Home / Self Care | Payer: Medicaid Other | Attending: Internal Medicine | Admitting: Internal Medicine

## 2016-06-20 ENCOUNTER — Encounter (HOSPITAL_COMMUNITY): Payer: Self-pay | Admitting: Emergency Medicine

## 2016-06-20 DIAGNOSIS — K029 Dental caries, unspecified: Secondary | ICD-10-CM

## 2016-06-20 DIAGNOSIS — K047 Periapical abscess without sinus: Secondary | ICD-10-CM

## 2016-06-20 MED ORDER — OXYCODONE-ACETAMINOPHEN 5-325 MG PO TABS: 1.0000 | ORAL_TABLET | Freq: Four times a day (QID) | ORAL | 0 refills | Status: DC | PRN

## 2016-06-20 MED ORDER — CLINDAMYCIN HCL 300 MG PO CAPS: 300.0000 mg | ORAL_CAPSULE | Freq: Three times a day (TID) | ORAL | 0 refills | Status: AC

## 2016-06-20 NOTE — ED Provider Notes (Signed)
CSN: [id number]     Arrival date & time 06/20/16  1446 History   First MD Initiated Contact with Patient 06/20/16 1717     Chief Complaint  Patient presents with  . Dental Pain   (Consider location/radiation/quality/duration/timing/severity/associated sxs/prior Treatment) 40 year old female presents with right lower back molar dental pain that started last evening. Believes she may have another abscess. Has had multiple dental abscesses in the past few years. Has taken Clindamycin in the past with success. She is about [redacted] weeks pregnant according to her LMP. She has not had routine prenatal care but has an appointment this week for a routine check-up. She did not take any medication yesterday or today for pain since she is pregnant and uncertain what else she can take besides Tylenol.       Past Medical History:  Diagnosis Date  . Hypertension   . Molar pregnancy   . Panic attacks    " a long time ago"  . Stillbirth   . Trichomonas    Past Surgical History:  Procedure Laterality Date  . CESAREAN SECTION    . DILATION AND CURETTAGE OF UTERUS     Family History  Problem Relation Age of Onset  . Hypertension Maternal Grandfather   . Depression Cousin   . Alcohol abuse Neg Hx   . Arthritis Neg Hx   . Asthma Neg Hx   . Birth defects Neg Hx   . Cancer Neg Hx   . COPD Neg Hx   . Diabetes Neg Hx   . Drug abuse Neg Hx   . Early death Neg Hx   . Hearing loss Neg Hx   . Hyperlipidemia Neg Hx   . Heart disease Neg Hx   . Kidney disease Neg Hx   . Learning disabilities Neg Hx   . Mental illness Neg Hx   . Mental retardation Neg Hx   . Miscarriages / Stillbirths Neg Hx   . Stroke Neg Hx   . Vision loss Neg Hx    Social History  Substance Use Topics  . Smoking status: Never Smoker  . Smokeless tobacco: Never Used  . Alcohol use No   OB History    Gravida Para Term Preterm AB Living   11 7 6 1 2 6    SAB TAB Ectopic Multiple Live Births   2 0 0 0 6     Review of  Systems  Constitutional: Negative for chills, fatigue and fever.  HENT: Positive for dental problem. Negative for congestion, facial swelling and mouth sores.   Skin: Negative for rash.  Neurological: Negative for dizziness, weakness and headaches.    Allergies  Penicillins  Home Medications   Prior to Admission medications   Medication Sig Start Date End Date Taking? Authorizing Provider  clindamycin (CLEOCIN) 300 MG capsule Take 1 capsule (300 mg total) by mouth 3 (three) times daily. 06/20/16 06/30/16  Sudie Grumbling, NP  oxyCODONE-acetaminophen (PERCOCET/ROXICET) 5-325 MG tablet Take 1-2 tablets by mouth every 6 (six) hours as needed for severe pain. 06/20/16   Sudie Grumbling, NP  prenatal vitamin w/FE, FA (PRENATAL 1 + 1) 27-1 MG TABS tablet Take 1 tablet by mouth daily. 02/23/16   Deirdre Colin Mulders, CNM   Meds Ordered and Administered this Visit  Medications - No data to display  BP 136/88 (BP Location: Left Arm)   Pulse 109   Temp 98.5 F (36.9 C) (Oral)   Resp 18   LMP 01/05/2016 (Approximate)  Comment: ? April  SpO2 100%  No data found.   Physical Exam  Constitutional: She is oriented to person, place, and time. She appears well-developed and well-nourished. No distress.  HENT:  Head: Normocephalic and atraumatic.  Nose: Nose normal.  Mouth/Throat: Oropharynx is clear and moist and mucous membranes are normal. Abnormal dentition. Dental abscesses and dental caries present.    Red, swollen gum and very tender along lateral side of last molar on right lower side. 1st and 2nd molar on right lower are in poor repair as well. No distinct discharge.   Neck: Normal range of motion. Neck supple.  Cardiovascular: Normal rate and regular rhythm.   Pulmonary/Chest: Effort normal and breath sounds normal.  Lymphadenopathy:       Head (right side): No submandibular, no tonsillar, no preauricular and no posterior auricular adenopathy present.    She has no cervical adenopathy.    Neurological: She is alert and oriented to person, place, and time.  Skin: Skin is warm and dry.  Psychiatric: She has a normal mood and affect. Her behavior is normal. Judgment and thought content normal.    Urgent Care Course   Clinical Course    Procedures (including critical care time)  Labs Review Labs Reviewed - No data to display  Imaging Review No results found.   Visual Acuity Review  Right Eye Distance:   Left Eye Distance:   Bilateral Distance:    Right Eye Near:   Left Eye Near:    Bilateral Near:         MDM   1. Dental abscess   2. Dental caries    Recommend start Cleocin 300mg  3 times a day for 10 days. May take Percocet 1-2 tablets every 6 hours as needed for pain #8 no refill- using sparingly. Strongly encouraged to see a dentist regarding dental care. Keep appointment with OB/GYN for prenatal care this week. Follow-up with the dentist as planned or go to ER if pain gets more severe.     Sudie GrumblingAnn Berry Mylez Venable, NP 06/20/16 2202

## 2016-06-20 NOTE — Discharge Instructions (Signed)
Start antibiotic Clindamycin 3 times a day for 10 days for dental infection. May take Percocet 1 to 2 tablets every 6 hours as needed for pain. Follow-up with a dentist for further evaluation.

## 2016-06-20 NOTE — ED Triage Notes (Signed)
Patient c/o tooth pain onset yesterday. She thinks she has an abscess. Patient reports she has had one before. She is pregnant, so has only bee taking tylenol.

## 2016-07-07 ENCOUNTER — Encounter: Payer: Medicaid Other | Admitting: Advanced Practice Midwife

## 2016-07-14 ENCOUNTER — Encounter: Payer: Medicaid Other | Admitting: Obstetrics and Gynecology

## 2016-08-02 ENCOUNTER — Ambulatory Visit: Payer: Medicaid Other | Admitting: Obstetrics and Gynecology

## 2016-08-02 VITALS — BP 144/83 | HR 98 | Wt 192.5 lb

## 2016-08-02 DIAGNOSIS — Z3490 Encounter for supervision of normal pregnancy, unspecified, unspecified trimester: Secondary | ICD-10-CM

## 2016-08-02 LAB — POCT URINALYSIS DIP (DEVICE)
BILIRUBIN URINE: NEGATIVE
Glucose, UA: NEGATIVE mg/dL
HGB URINE DIPSTICK: NEGATIVE
KETONES UR: NEGATIVE mg/dL
LEUKOCYTES UA: NEGATIVE
Nitrite: NEGATIVE
Protein, ur: NEGATIVE mg/dL
SPECIFIC GRAVITY, URINE: 1.02 (ref 1.005–1.030)
UROBILINOGEN UA: 1 mg/dL (ref 0.0–1.0)
pH: 6.5 (ref 5.0–8.0)

## 2016-08-02 NOTE — Progress Notes (Signed)
Pt had to leave due to family emergency and wasn't able to stay for her OB visit, but was given the date and time of her anatomy scan on check in. Will have front desk call and r/s NOB appt asap.  Eileen [name], Jr MD Attending [facility](Faculty Practice)

## 2016-08-02 NOTE — Progress Notes (Signed)
Here for initial prenatal visit. Declines flu and tdap for now. Given new patient education booklets.

## 2016-08-09 ENCOUNTER — Encounter (HOSPITAL_COMMUNITY): Payer: Self-pay | Admitting: Obstetrics and Gynecology

## 2016-08-11 ENCOUNTER — Encounter: Payer: Medicaid Other | Admitting: Obstetrics and Gynecology

## 2016-08-13 ENCOUNTER — Ambulatory Visit (HOSPITAL_COMMUNITY)
Admission: RE | Admit: 2016-08-13 | Discharge: 2016-08-13 | Disposition: A | Discharge status: Home / Self Care | Payer: Medicaid Other | Source: Ambulatory Visit | Attending: Obstetrics and Gynecology | Admitting: Obstetrics and Gynecology

## 2016-08-13 ENCOUNTER — Other Ambulatory Visit: Payer: Self-pay | Admitting: Obstetrics and Gynecology

## 2016-08-13 DIAGNOSIS — O34219 Maternal care for unspecified type scar from previous cesarean delivery: Secondary | ICD-10-CM | POA: Insufficient documentation

## 2016-08-13 DIAGNOSIS — Z3A33 33 weeks gestation of pregnancy: Secondary | ICD-10-CM | POA: Diagnosis not present

## 2016-08-13 DIAGNOSIS — O09293 Supervision of pregnancy with other poor reproductive or obstetric history, third trimester: Secondary | ICD-10-CM | POA: Diagnosis not present

## 2016-08-13 DIAGNOSIS — O0933 Supervision of pregnancy with insufficient antenatal care, third trimester: Secondary | ICD-10-CM | POA: Insufficient documentation

## 2016-08-13 DIAGNOSIS — Z3490 Encounter for supervision of normal pregnancy, unspecified, unspecified trimester: Secondary | ICD-10-CM

## 2016-08-13 DIAGNOSIS — O09523 Supervision of elderly multigravida, third trimester: Secondary | ICD-10-CM | POA: Insufficient documentation

## 2016-08-13 DIAGNOSIS — Z3493 Encounter for supervision of normal pregnancy, unspecified, third trimester: Secondary | ICD-10-CM | POA: Diagnosis present

## 2016-08-25 ENCOUNTER — Encounter: Payer: Medicaid Other | Admitting: Advanced Practice Midwife

## 2016-08-25 ENCOUNTER — Encounter: Payer: Self-pay | Admitting: Family Medicine

## 2016-09-02 ENCOUNTER — Encounter: Payer: Medicaid Other | Admitting: Obstetrics & Gynecology

## 2016-09-02 ENCOUNTER — Encounter: Payer: Self-pay | Admitting: Obstetrics & Gynecology

## 2016-09-13 NOTE — L&D Delivery Note (Signed)
   Delivery Note AROM around 1930 with lightly stained meconium fluid.  Shortly afterwards, she developed an urge to push.  After a 2 contraction 2nd stage, at 8:04 PM a viable female was delivered via  (Presentation: LAO;  ).  APGAR  8/9 weight pending .   After 1 minute, the cord was clamped and cut. 40 units of pitocin diluted in 1000cc LR was infused rapidly IV.  The placenta separated spontaneously and delivered via CCT and maternal pushing effort.  It was inspected and appears to be intact with a 3 VC  Anesthesia:  none Episiotomy: None Lacerations: None Suture Repair:  Est. Blood Loss (mL): 200  Mom to postpartum.  Baby to Couplet care / Skin to Skin.  CRESENZO-DISHMAN,Emmit Oriley 09/16/2016, 8:15 PM     .

## 2016-09-16 ENCOUNTER — Encounter (HOSPITAL_COMMUNITY): Payer: Self-pay

## 2016-09-16 ENCOUNTER — Inpatient Hospital Stay (HOSPITAL_COMMUNITY)
Admission: AD | Admit: 2016-09-16 | Discharge: 2016-09-18 | DRG: 774 | Disposition: A | Discharge status: Home / Self Care | Payer: Medicaid Other | Source: Ambulatory Visit | Attending: Obstetrics & Gynecology | Admitting: Obstetrics & Gynecology

## 2016-09-16 DIAGNOSIS — O1092 Unspecified pre-existing hypertension complicating childbirth: Secondary | ICD-10-CM

## 2016-09-16 DIAGNOSIS — Z3A38 38 weeks gestation of pregnancy: Secondary | ICD-10-CM | POA: Diagnosis not present

## 2016-09-16 DIAGNOSIS — O34211 Maternal care for low transverse scar from previous cesarean delivery: Secondary | ICD-10-CM | POA: Diagnosis present

## 2016-09-16 DIAGNOSIS — O0933 Supervision of pregnancy with insufficient antenatal care, third trimester: Secondary | ICD-10-CM

## 2016-09-16 DIAGNOSIS — O1002 Pre-existing essential hypertension complicating childbirth: Secondary | ICD-10-CM | POA: Diagnosis present

## 2016-09-16 LAB — RAPID URINE DRUG SCREEN, HOSP PERFORMED
AMPHETAMINES: NOT DETECTED
BENZODIAZEPINES: NOT DETECTED
Barbiturates: NOT DETECTED
COCAINE: NOT DETECTED
Opiates: NOT DETECTED
Tetrahydrocannabinol: NOT DETECTED

## 2016-09-16 LAB — PROTEIN / CREATININE RATIO, URINE
Creatinine, Urine: 24 mg/dL
Protein Creatinine Ratio: 0.25 mg/mg{Cre} — ABNORMAL HIGH (ref 0.00–0.15)
TOTAL PROTEIN, URINE: 6 mg/dL

## 2016-09-16 LAB — COMPREHENSIVE METABOLIC PANEL
ALBUMIN: 3 g/dL — AB (ref 3.5–5.0)
ALK PHOS: 268 U/L — AB (ref 38–126)
ALT: 10 U/L — AB (ref 14–54)
AST: 17 U/L (ref 15–41)
Anion gap: 10 (ref 5–15)
BILIRUBIN TOTAL: 0.4 mg/dL (ref 0.3–1.2)
BUN: 5 mg/dL — ABNORMAL LOW (ref 6–20)
CALCIUM: 8.7 mg/dL — AB (ref 8.9–10.3)
CO2: 19 mmol/L — ABNORMAL LOW (ref 22–32)
Chloride: 108 mmol/L (ref 101–111)
Creatinine, Ser: 0.84 mg/dL (ref 0.44–1.00)
GFR calc Af Amer: >60 mL/min (ref >60)
GLUCOSE: 103 mg/dL — AB (ref 65–99)
POTASSIUM: 3.1 mmol/L — AB (ref 3.5–5.1)
Sodium: 137 mmol/L (ref 135–145)
Total Protein: 6.9 g/dL (ref 6.5–8.1)

## 2016-09-16 LAB — URINALYSIS, ROUTINE W REFLEX MICROSCOPIC
Bilirubin Urine: NEGATIVE
Glucose, UA: NEGATIVE mg/dL
Ketones, ur: NEGATIVE mg/dL
Nitrite: NEGATIVE
PROTEIN: NEGATIVE mg/dL
RBC / HPF: NONE SEEN RBC/hpf (ref 0–5)
SPECIFIC GRAVITY, URINE: 1.001 — AB (ref 1.005–1.030)
pH: 7 (ref 5.0–8.0)

## 2016-09-16 LAB — CBC
HEMATOCRIT: 30.6 % — AB (ref 36.0–46.0)
HEMOGLOBIN: 10.7 g/dL — AB (ref 12.0–15.0)
MCH: 27.3 pg (ref 26.0–34.0)
MCHC: 35 g/dL (ref 30.0–36.0)
MCV: 78.1 fL (ref 78.0–100.0)
Platelets: 225 10*3/uL (ref 150–400)
RBC: 3.92 MIL/uL (ref 3.87–5.11)
RDW: 15.9 % — AB (ref 11.5–15.5)
WBC: 10.4 10*3/uL (ref 4.0–10.5)

## 2016-09-16 LAB — RAPID HIV SCREEN (HIV 1/2 AB+AG)
HIV 1/2 Antibodies: NONREACTIVE
HIV-1 P24 Antigen - HIV24: NONREACTIVE

## 2016-09-16 LAB — TYPE AND SCREEN
ABO/RH(D): B POS
ANTIBODY SCREEN: NEGATIVE

## 2016-09-16 LAB — GROUP B STREP BY PCR: GROUP B STREP BY PCR: NEGATIVE

## 2016-09-16 LAB — HEPATITIS B SURFACE ANTIGEN: Hepatitis B Surface Ag: NEGATIVE

## 2016-09-16 MED ORDER — COCONUT OIL OIL: 1.0000 "application " | TOPICAL_OIL | Status: DC | PRN

## 2016-09-16 MED ORDER — LIDOCAINE HCL (PF) 1 % IJ SOLN
30.0000 mL | INTRAMUSCULAR | Status: DC | PRN
  Filled 2016-09-16: qty 30

## 2016-09-16 MED ORDER — LACTATED RINGERS IV SOLN
INTRAVENOUS | Status: DC
  Administered 2016-09-16: 18:00:00 via INTRAVENOUS

## 2016-09-16 MED ORDER — PRENATAL MULTIVITAMIN CH
1.0000 | ORAL_TABLET | Freq: Every day | ORAL | Status: DC
  Administered 2016-09-17 – 2016-09-18 (×2): 1 via ORAL
  Filled 2016-09-16 (×2): qty 1

## 2016-09-16 MED ORDER — DOCUSATE SODIUM 100 MG PO CAPS
100.0000 mg | ORAL_CAPSULE | Freq: Two times a day (BID) | ORAL | Status: DC
  Administered 2016-09-16 – 2016-09-18 (×4): 100 mg via ORAL
  Filled 2016-09-16 (×4): qty 1

## 2016-09-16 MED ORDER — ACETAMINOPHEN 325 MG PO TABS
650.0000 mg | ORAL_TABLET | ORAL | Status: DC | PRN
  Administered 2016-09-17 (×3): 650 mg via ORAL
  Filled 2016-09-16 (×3): qty 2

## 2016-09-16 MED ORDER — FLEET ENEMA 7-19 GM/118ML RE ENEM: 1.0000 | ENEMA | Freq: Every day | RECTAL | Status: DC | PRN

## 2016-09-16 MED ORDER — IBUPROFEN 600 MG PO TABS
600.0000 mg | ORAL_TABLET | Freq: Four times a day (QID) | ORAL | Status: DC
  Administered 2016-09-17 – 2016-09-18 (×6): 600 mg via ORAL
  Filled 2016-09-16 (×7): qty 1

## 2016-09-16 MED ORDER — LACTATED RINGERS IV SOLN: 500.0000 mL | INTRAVENOUS | Status: DC | PRN

## 2016-09-16 MED ORDER — DIBUCAINE 1 % RE OINT: 1.0000 "application " | TOPICAL_OINTMENT | RECTAL | Status: DC | PRN

## 2016-09-16 MED ORDER — OXYTOCIN 40 UNITS IN LACTATED RINGERS INFUSION - SIMPLE MED
2.5000 [IU]/h | INTRAVENOUS | Status: DC
  Filled 2016-09-16: qty 1000

## 2016-09-16 MED ORDER — ONDANSETRON HCL 4 MG/2ML IJ SOLN: 4.0000 mg | INTRAMUSCULAR | Status: DC | PRN

## 2016-09-16 MED ORDER — OXYTOCIN BOLUS FROM INFUSION: 500.0000 mL | Freq: Once | INTRAVENOUS | Status: DC

## 2016-09-16 MED ORDER — TETANUS-DIPHTH-ACELL PERTUSSIS 5-2.5-18.5 LF-MCG/0.5 IM SUSP: 0.5000 mL | Freq: Once | INTRAMUSCULAR | Status: DC

## 2016-09-16 MED ORDER — SIMETHICONE 80 MG PO CHEW: 80.0000 mg | CHEWABLE_TABLET | ORAL | Status: DC | PRN

## 2016-09-16 MED ORDER — OXYCODONE-ACETAMINOPHEN 5-325 MG PO TABS: 1.0000 | ORAL_TABLET | ORAL | Status: DC | PRN

## 2016-09-16 MED ORDER — SOD CITRATE-CITRIC ACID 500-334 MG/5ML PO SOLN: 30.0000 mL | ORAL | Status: DC | PRN

## 2016-09-16 MED ORDER — MEASLES, MUMPS & RUBELLA VAC ~~LOC~~ INJ: 0.5000 mL | INJECTION | Freq: Once | SUBCUTANEOUS | Status: DC

## 2016-09-16 MED ORDER — OXYCODONE HCL 5 MG PO TABS: 5.0000 mg | ORAL_TABLET | ORAL | Status: DC | PRN

## 2016-09-16 MED ORDER — BISACODYL 10 MG RE SUPP: 10.0000 mg | Freq: Every day | RECTAL | Status: DC | PRN

## 2016-09-16 MED ORDER — FERROUS SULFATE 325 (65 FE) MG PO TABS
325.0000 mg | ORAL_TABLET | Freq: Two times a day (BID) | ORAL | Status: DC
  Administered 2016-09-17 – 2016-09-18 (×3): 325 mg via ORAL
  Filled 2016-09-16 (×3): qty 1

## 2016-09-16 MED ORDER — ACETAMINOPHEN 325 MG PO TABS: 650.0000 mg | ORAL_TABLET | ORAL | Status: DC | PRN

## 2016-09-16 MED ORDER — FENTANYL CITRATE (PF) 100 MCG/2ML IJ SOLN: 50.0000 ug | Freq: Once | INTRAMUSCULAR | Status: DC

## 2016-09-16 MED ORDER — METHYLERGONOVINE MALEATE 0.2 MG PO TABS: 0.2000 mg | ORAL_TABLET | ORAL | Status: DC | PRN

## 2016-09-16 MED ORDER — ZOLPIDEM TARTRATE 5 MG PO TABS: 5.0000 mg | ORAL_TABLET | Freq: Every evening | ORAL | Status: DC | PRN

## 2016-09-16 MED ORDER — WITCH HAZEL-GLYCERIN EX PADS: 1.0000 "application " | MEDICATED_PAD | CUTANEOUS | Status: DC | PRN

## 2016-09-16 MED ORDER — OXYCODONE-ACETAMINOPHEN 5-325 MG PO TABS: 2.0000 | ORAL_TABLET | ORAL | Status: DC | PRN

## 2016-09-16 MED ORDER — BENZOCAINE-MENTHOL 20-0.5 % EX AERO
1.0000 "application " | INHALATION_SPRAY | CUTANEOUS | Status: DC | PRN
  Filled 2016-09-16: qty 56

## 2016-09-16 MED ORDER — ONDANSETRON HCL 4 MG/2ML IJ SOLN: 4.0000 mg | Freq: Four times a day (QID) | INTRAMUSCULAR | Status: DC | PRN

## 2016-09-16 MED ORDER — ONDANSETRON HCL 4 MG PO TABS: 4.0000 mg | ORAL_TABLET | ORAL | Status: DC | PRN

## 2016-09-16 MED ORDER — OXYCODONE HCL 5 MG PO TABS: 10.0000 mg | ORAL_TABLET | ORAL | Status: DC | PRN

## 2016-09-16 MED ORDER — FENTANYL CITRATE (PF) 100 MCG/2ML IJ SOLN
50.0000 ug | INTRAMUSCULAR | Status: DC | PRN
  Administered 2016-09-16 (×2): 50 ug via INTRAVENOUS
  Filled 2016-09-16 (×2): qty 2

## 2016-09-16 MED ORDER — DIPHENHYDRAMINE HCL 25 MG PO CAPS: 25.0000 mg | ORAL_CAPSULE | Freq: Four times a day (QID) | ORAL | Status: DC | PRN

## 2016-09-16 MED ORDER — METHYLERGONOVINE MALEATE 0.2 MG/ML IJ SOLN: 0.2000 mg | INTRAMUSCULAR | Status: DC | PRN

## 2016-09-16 NOTE — H&P (Signed)
LABOR AND DELIVERY ADMISSION HISTORY AND PHYSICAL NOTE  Eileen [name] is a 41 y.o. female [phone] with IUP at [redacted]w[redacted]d by [place] and history of chronic hypertension and insufficient prenatal care who presented to the maternal admissions unit with on and off again pressure in her abdomen for two weeks that is worsening today. Denies any CP, shortness of breath, NVD, anorexia. Does endorse ocassional headaches that go away with tylenol and denies any vision changes. Patient did not receive any prenatal care aside from one visit, for which she had to leave early before she was seen.  Additionally, she was diagnosed with chlamydia infection in June and was reportedly treated, however was never retested. States that she did not receive care because she was scared and did not want to have the baby.  She now feels differently and is excited to have the baby because this is her boyfriend's first child.  Denies drugs, alcohol or tobacco use.   In MAU cervical check was initially 5cm and 80% effacement and eventually was 7cm and 100% effacement with -1 station. Baby had FHR with good variability and no decels.   She reports positive fetal movement. She denies leakage of fluid or vaginal bleeding.  Prenatal History/Complications:  Past Medical History: Past Medical History:  Diagnosis Date  . Hypertension   . Molar pregnancy   . Panic attacks    " a long time ago"  . Stillbirth   . Trichomonas     Past Surgical History: Past Surgical History:  Procedure Laterality Date  . CESAREAN SECTION    . DILATION AND CURETTAGE OF UTERUS      Obstetrical History: OB History    Gravida Para Term Preterm AB Living   11 7 6 1 2 7    SAB TAB Ectopic Multiple Live Births   2 0 0 0 6      Social History: Social History   Social History  . Marital status: Legally Separated    Spouse name: N/A  . Number of children: N/A  . Years of education: N/A   Social History Main Topics  . Smoking status: Never Smoker   . Smokeless tobacco: Never Used  . Alcohol use No  . Drug use: No  . Sexual activity: Yes     Comment: last intercourse July   Other Topics Concern  . None   Social History Narrative  . None    Family History: Family History  Problem Relation Age of Onset  . Hypertension Maternal Grandfather   . Depression Cousin   . Alcohol abuse Neg Hx   . Arthritis Neg Hx   . Asthma Neg Hx   . Birth defects Neg Hx   . Cancer Neg Hx   . COPD Neg Hx   . Diabetes Neg Hx   . Drug abuse Neg Hx   . Early death Neg Hx   . Hearing loss Neg Hx   . Hyperlipidemia Neg Hx   . Heart disease Neg Hx   . Kidney disease Neg Hx   . Learning disabilities Neg Hx   . Mental illness Neg Hx   . Mental retardation Neg Hx   . Miscarriages / Stillbirths Neg Hx   . Stroke Neg Hx   . Vision loss Neg Hx     Allergies: Allergies  Allergen Reactions  . Penicillins Shortness Of Breath and Swelling    Has patient had a PCN reaction causing immediate rash, facial/tongue/throat swelling, SOB or lightheadedness with hypotension: Yes  Has patient had a PCN reaction causing severe rash involving mucus membranes or skin necrosis: Yes Has patient had a PCN reaction that required hospitalization No Has patient had a PCN reaction occurring within the last 10 years: No If all of the above answers are "NO", then may proceed with Cephalosporin use.     Prescriptions Prior to Admission  Medication Sig Dispense Refill Last Dose  . Ca Carbonate-Mag Hydroxide (ROLAIDS PO) Take 2 each by mouth daily as needed (For heartburn.).   Past Month at Unknown time     Review of Systems   All systems reviewed and negative except as stated in HPI  Blood pressure 138/70, pulse 93, temperature 97.8 F (36.6 C), resp. rate 16, height 5\' 3"  (1.6 m), weight 84.8 kg (187 lb), last menstrual period 01/05/2016, unknown if currently breastfeeding. General appearance: alert and cooperative Lungs: clear to auscultation  bilaterally Heart: regular rate and rhythm Abdomen: soft, non-tender; bowel sounds normal Extremities: No calf swelling or tenderness Presentation: cephalic vertex Fetal monitoring: 150s baseline Uterine activity:  DilatioN: 7 Effacement: 100% Station: -1 BBOW  Prenatal labs: ABO, Rh: B Pos Antibody: Unknown Rubella: Unknown RPR:   Unknown HBsAg:   Unknown HIV: Non Reactive (06/12 1546) GBS:   Neg 1 hr Glucola: Unknown Genetic screening:  NA Anatomy US: 57%ile at 33 wks  Prenatal Transfer Tool  Maternal Diabetes: Unknown Genetic Screening:  no prenatal care Maternal Ultrasounds/Referrals: Normal Fetal Ultrasounds or other Referrals:  None Maternal Substance Abuse:  No UDS negative Significant Maternal Medications:  None Significant Maternal Lab Results:  NA  Results for orders placed or performed during the hospital encounter of 09/16/16 (from the past 24 hour(s))  Urinalysis, Routine w reflex microscopic   Collection Time: 09/16/16  3:30 PM  Result Value Ref Range   Color, Urine STRAW (A) YELLOW   APPearance CLEAR CLEAR   Specific Gravity, Urine 1.001 (L) 1.005 - 1.030   pH 7.0 5.0 - 8.0   Glucose, UA NEGATIVE NEGATIVE mg/dL   Hgb urine dipstick SMALL (A) NEGATIVE   Bilirubin Urine NEGATIVE NEGATIVE   Ketones, ur NEGATIVE NEGATIVE mg/dL   Protein, ur NEGATIVE NEGATIVE mg/dL   Nitrite NEGATIVE NEGATIVE   Leukocytes, UA TRACE (A) NEGATIVE   RBC / HPF NONE SEEN 0 - 5 RBC/hpf   WBC, UA 0-5 0 - 5 WBC/hpf   Bacteria, UA RARE (A) NONE SEEN   Squamous Epithelial / LPF 0-5 (A) NONE SEEN  Protein / creatinine ratio, urine   Collection Time: 09/16/16  3:30 PM  Result Value Ref Range   Creatinine, Urine 24.00 mg/dL   Total Protein, Urine 6 mg/dL   Protein Creatinine Ratio 0.25 (H) 0.00 - 0.15 mg/mg[Cre]  Urine rapid drug screen (hosp performed)   Collection Time: 09/16/16  3:30 PM  Result Value Ref Range   Opiates NONE DETECTED NONE DETECTED   Cocaine NONE DETECTED  NONE DETECTED   Benzodiazepines NONE DETECTED NONE DETECTED   Amphetamines NONE DETECTED NONE DETECTED   Tetrahydrocannabinol NONE DETECTED NONE DETECTED   Barbiturates NONE DETECTED NONE DETECTED  CBC   Collection Time: 09/16/16  5:08 PM  Result Value Ref Range   WBC 10.4 4.0 - 10.5 K/uL   RBC 3.92 3.87 - 5.11 MIL/uL   Hemoglobin 10.7 (L) 12.0 - 15.0 g/dL   HCT 81.1 (L) 91.4 - 78.2 %   MCV 78.1 78.0 - 100.0 fL   MCH 27.3 26.0 - 34.0 pg   MCHC 35.0 30.0 -  36.0 g/dL   RDW 95.215.9 (H) 84.111.5 - 32.415.5 %   Platelets 225 150 - 400 K/uL  Comprehensive metabolic panel   Collection Time: 09/16/16  5:08 PM  Result Value Ref Range   Sodium 137 135 - 145 mmol/L   Potassium 3.1 (L) 3.5 - 5.1 mmol/L   Chloride 108 101 - 111 mmol/L   CO2 19 (L) 22 - 32 mmol/L   Glucose, Bld 103 (H) 65 - 99 mg/dL   BUN 5 (L) 6 - 20 mg/dL   Creatinine, Ser 4.010.84 0.44 - 1.00 mg/dL   Calcium 8.7 (L) 8.9 - 10.3 mg/dL   Total Protein 6.9 6.5 - 8.1 g/dL   Albumin 3.0 (L) 3.5 - 5.0 g/dL   AST 17 15 - 41 U/L   ALT 10 (L) 14 - 54 U/L   Alkaline Phosphatase 268 (H) 38 - 126 U/L   Total Bilirubin 0.4 0.3 - 1.2 mg/dL   GFR calc non Af Amer >60 >60 mL/min   GFR calc Af Amer >60 >60 mL/min   Anion gap 10 5 - 15  Rapid HIV screen (HIV 1/2 Ab+Ag)   Collection Time: 09/16/16  5:08 PM  Result Value Ref Range   HIV-1 P24 Antigen - HIV24 NON REACTIVE NON REACTIVE   HIV 1/2 Antibodies NON REACTIVE NON REACTIVE   Interpretation (HIV Ag Ab)      A non reactive test result means that HIV 1 or HIV 2 antibodies and HIV 1 p24 antigen were not detected in the specimen.  Type and screen   Collection Time: 09/16/16  5:08 PM  Result Value Ref Range   ABO/RH(D) B POS    Antibody Screen NEG    Sample Expiration 09/19/2016   Group B strep by PCR   Collection Time: 09/16/16  5:32 PM  Result Value Ref Range   Group B strep by PCR NEGATIVE NEGATIVE    Patient Active Problem List   Diagnosis Date Noted  . Normal labor 09/16/2016  .  Supervision of normal pregnancy 08/02/2016  . 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 Bleeding in early pregnancy 02/23/2016  . Trichomonas vaginalis (TV) infection 02/23/2016  . Chronic hypertension during pregnancy, antepartum 02/23/2016  . AMA (advanced maternal age) multigravida 35+ 02/23/2016  . 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 History of stillbirth 12/06/2012  . Chlamydia infection, current pregnancy 11/16/2012  . Insufficient prenatal care 11/13/2012  . Urinary retention with incomplete bladder emptying 06/16/2012    Assessment: Melchor AmourMary A Edgerly is a 41 y.o. [id number] at 1353w4d here for abdominal pain and contractions for two weeks worsening today and found to be dilated to 7cm with 100% effacement and -1 station.  Will be admitted to L&D for expectant management for SVD. Patient has had previous c-sections and we discussed risks of VBAC and she understands and agreed.  Additionally, given patient's history of chronic hypertension and elevated blood pressures upon presentation we will continue to monitor very closely and follow up with preeclampsia labs. It is reassuring that she has no signs or symptoms of end organ damage.  We have collected swabs for gc/chamlydia and prenatal labs and will treat as appropriate.   #Labor: expectant management SVD, VBAC #Pain: IV pain medication #FWB: Category I #ID: GBS negative #MOF: breast/bottle #MOC: Requested tubal # No prenatal care: f/u prenatal labs # VBAC: consent signed # Chronic hypertension: continue to monitor, f/u protein/creatinine ratio Treat with PRN antihypertensives if BP <160/110  Renne Muscaaniel L Warden, MD PGY-1 09/16/2016, 7:29 PM  Midwife attestation: I have seen and examined this patient; I agree with above documentation in the resident's note.   MUNIRAH DOERNER is a 41 y.o. [id number] here for labor  PE: Gen: calm comfortable, NAD Resp: normal effort, no distress Abd: gravid  ROS, labs, PMH reviewed  Assessment/Plan: Admit to  LD Labor: active FWB: Cat I ID: GBS neg  Donette Larry, CNM  09/16/2016, 7:38 PM

## 2016-09-16 NOTE — MAU Note (Signed)
Pt presents to MAU with complaints of contractions that started a couple of weeks ago but have gotten worse today. Denies any vaginal bleeding or LOF

## 2016-09-17 ENCOUNTER — Encounter (HOSPITAL_COMMUNITY): Payer: Self-pay | Admitting: *Deleted

## 2016-09-17 LAB — BASIC METABOLIC PANEL
Anion gap: 6 (ref 5–15)
BUN: 6 mg/dL (ref 6–20)
CALCIUM: 8.3 mg/dL — AB (ref 8.9–10.3)
CO2: 24 mmol/L (ref 22–32)
Chloride: 106 mmol/L (ref 101–111)
Creatinine, Ser: 0.82 mg/dL (ref 0.44–1.00)
GFR calc Af Amer: >60 mL/min (ref >60)
Glucose, Bld: 91 mg/dL (ref 65–99)
POTASSIUM: 3.5 mmol/L (ref 3.5–5.1)
SODIUM: 136 mmol/L (ref 135–145)

## 2016-09-17 LAB — RUBELLA SCREEN: Rubella: 1.48 index (ref >0.99)

## 2016-09-17 LAB — RPR: RPR Ser Ql: NONREACTIVE

## 2016-09-17 MED ORDER — POTASSIUM CHLORIDE CRYS ER 20 MEQ PO TBCR
40.0000 meq | EXTENDED_RELEASE_TABLET | Freq: Once | ORAL | Status: DC
  Filled 2016-09-17: qty 2

## 2016-09-17 NOTE — Progress Notes (Addendum)
CLINICAL SOCIAL WORK MATERNAL/CHILD NOTE  Patient Details  Name: Eileen [name] MRN: [id number] Date of Birth: 09/16/2016  Date:  09/17/2016  Clinical Social Worker Initiating Note:  Terri Piedra, [place] Date/ Time Initiated:  09/17/16/1536     Child's Name:  Eileen [name]   Legal Guardian:  Other (Comment) (Parents: Marisa Severin and Valetta Mole)   Need for Interpreter:  None   Date of Referral:  09/17/16     Reason for Referral:  Late or No Prenatal Care , Other (Comment) (Hx of Panic Attacks)   Referral Source:  [facility]   Address:  [address], [place], [place] [address]  Phone number:  [phone]   Household Members:  Minor Children, Siblings, Significant Other (MOB states that she lives with FOB and his brother and 68 37 year old daughter while they look for their own housing.)   Natural Supports (not living in the home):  Immediate Family, Extended Family (MOB reports that she has good supports, especially FOB and his mother and brother.)   Professional Supports: None   Employment:     Type of Work: 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  She is looking for work.  FOB is a security guard at [employer].   Education:      Museum/gallery curator Resources:  Medicaid   Other Resources:  Physicist, medical , [place] ([place] appointment 09/20/16)   Cultural/Religious Considerations Which May Impact Care: None stated.  MOB's facesheet notes religion as Psychologist, forensic.  Strengths:  Ability to meet basic needs  (MOB states they have most items needed for baby.)   Risk Factors/Current Problems:  None   Cognitive State:  Alert , Able to Concentrate , Insightful , Linear Thinking , Goal Oriented    Mood/Affect:  Calm , Comfortable , Tearful , Interested    CSW Assessment: CSW met with MOB in her first floor room/144 to offer support and complete assessment due to hx of panic attacks, NPNC, and tearful in MAU.  MOB was pleasant and welcomed CSW into  the room.  She is here with her 35 year old daughter Vickii Chafe, who was very active and demanding of attention while CSW was in the room, making it difficult to talk with MOB.  RN had alerted CSW to 41 year old being present throughout MOB's entire hospitalization and asked if there were crayons that could be provided to her.  CSW obtained crayons, coloring sheets and a teddy bear from [employer] to give to 41 year old.  MOB smiled and 41 year old seemed very happy with these items. MOB reports that she is feeling well emotionally and much better physically.  She was easy to engage and seemed open to talking with CSW.  She reports that this is her 8th child and that she is happy about the baby.  She states this is her boyfriend' (Brian-age 23) first baby and that he seems very excited.  She reports that she has the following aged children: 33, 9, 84, 20, 41, 9, 3 and baby.  She reports that the three oldest are living on their own.  She states that her 47 year old daughter lives with her father and that she is in touch with her on a daily basis.  She reports that her 22 and 41 year old boys live with their father, which was an arrangement she and their father made over the summer when MOB lost her housing.  She  states she was living with a friend who got evicted.  She, her boyfriend and MOB's 40 year old went to live with FOB's brother.  She states she contacted the father of her 4 and 53 year old and asked if the boys can stay with him until she gets her own place and he agreed.  She states she sees them on a regular basis also.  MOB states that once they get their tax return, they plan to secure their own housing.  She states that they can remain with FOB's brother in the meantime.  MOB is looking for work and FOB is currently employed.  She states they will work opposite shifts so that they will not have to pay for outside childcare of the baby and three year old.  She reports that her three year old's father is  not involved and that FOB has stepped in and been very supportive to this child.  CSW suggests MOB apply at [facility] as a housing resource.  She states she has heard of this program and may attempt to get on their waiting list. MOB reports that she has plenty of clothes for baby, but is still working to get more diapers and some other necessities.  She feels she will be able to.  She states PGM has purchased a car seat for baby.  CSW offered a bundle pack from [employer] and MOB was very Patent attorney.  MOB states she does not have a place for baby to sleep and plans to have her sleep in bed with her.  CSW cautioned against this and provided education regarding SIDS precautions.  CSW instructed her on how to make a baby bed from a drawer or laundry basket, keeping in mind that it needs to be on the floor, but not where the three year old has access to the baby.  MOB stated understanding.  CSW will make referral to the Health Department for [facility] and a baby box.  MOB agreed and was appreciative.  CSW left message for N. Finch/CC4C RN.   CSW inquired about MOB's emotional health.  She reports no hx of PMADs following her other deliveries.  She reports some Anxiety and Depression after her stillborn a few years ago.  She reports that she delivered a baby Eileen on 4/13 (unsure of the year) and that around this time each year she becomes sad and tearful.  She became tearful as she spoke of the daughter she lost.  CSW asked if she has received counseling for this and she states, "I try not to think about it."  CSW encouraged her to acknowledge her grief and to give herself time to mourn periodically.  We discussed how grief continues past the initial trauma and is not something that a person "gets over."  MOB seemed to appreciate the discussion and CSW's concern for her emotional wellbeing.  She does not feel she is in need of counseling at this time.  CSW provided education regarding PMADs and stressed  the importance of talking with a medical professional if she has concerns about her mental health at any time.  CSW provided MOB with a brochure about the various support groups for moms at [facility] and gave her a "New Mom Checklist" as a Occupational psychologist.  MOB accepted resources and thanked CSW.   CSW inquired about [facility] and MOB states she was only able to come once because she had "so much going on," referring to her housing situation and  settling her children in other places while they moved in with FOB's brother.  She reports that she has transportation because FOB has a car.  FOB works 7pm-3am.  CSW informed MOB of hospital drug screen policy and mandated reporting to Designer, jewellery for positive screens due to [facility].  MOB stated understanding and no concerns.  CSW Plan/Description:  Information/Referral to [employer] , No Further Intervention Required/No Barriers to Discharge, Patient/Family Education     Alphonzo Cruise, [place] 09/17/2016, 3:40 PM

## 2016-09-17 NOTE — Progress Notes (Signed)
POSTPARTUM PROGRESS NOTE  Post Partum Day 1  Subjective:  Eileen [name] is a 41 y.o. [id number] 6563w4d s/p SVD.  No acute events overnight.  Pt denies problems with ambulating, voiding or po intake.  She denies nausea or vomiting.  Pain is well controlled.  She has had flatus. She has not had bowel movement.  Lochia Minimal.   Objective: Blood pressure 131/75, pulse 61, temperature 98.7 F (37.1 C), temperature source Oral, resp. rate 18, height 5\' 3"  (1.6 m), weight 84.8 kg (187 lb), last menstrual period 01/05/2016, unknown if currently breastfeeding.  Physical Exam:  General: alert, cooperative and no distress Lochia:normal flow Chest: CTAB Heart: RRR no m/r/g Abdomen: +BS, soft, nontender,  Uterine Fundus: firm DVT Evaluation: No calf swelling or tenderness Extremities: no edema   Recent Labs  09/16/16 1708  HGB 10.7*  HCT 30.6*    Assessment/Plan:  ASSESSMENT: Eileen [name] is a 41 y.o. [id number] 7163w4d s/p SVD  Plan for discharge tomorrow.  Placed SW consult given mother's lack of prenatal care.  Additionally, she was tearful in MAU yesterday when thinking of having another infant at home.    LOS: 1 day   Renne Muscaaniel L Warden, MD PGY-1 [facility]  09/17/2016, 7:31 AM   I have seen and examined this patient and agree the above assessment.  Respiratory effort normal, lochia appropriate, legs negative,  pain level normal.  CRESENZO-DISHMAN,[name] 09/22/2016 10:01 AM

## 2016-09-17 NOTE — Lactation Note (Signed)
This note was copied from a baby's chart. Lactation Consultation Note Mom's 7th baby, didn't BF any of her other children. Mom states she tried w/her last child and wouldn't latch. Mom is breast bottle, formula. Encouraged mom to breast feed first, then if baby needs supplemented. Encouraged to BF as much as she can for the first 2 weeks.  Educated of newborn behavior and feeding habits.  Mom has long pendulum breast w/everted small nipple at the bottom end of breast. Hand expression taught w/no colostrum noted at this time.  Mom encouraged to feed baby 8-12 times/24 hours and with feeding cues. WH/LC brochure given w/resources, support groups and LC services. Patient Name: Eileen [name] ZOXWR'UToday's Date: 09/17/2016 Reason for consult: Initial assessment   Maternal Data Has patient been taught Hand Expression?: Yes Does the patient have breastfeeding experience prior to this delivery?: No  Feeding Feeding Type: Formula Nipple Type: Slow - flow  LATCH Score/Interventions                      Lactation Tools Discussed/Used     Consult Status Consult Status: Follow-up Date: 09/18/16 Follow-up type: In-patient    Charyl DancerCARVER, Arisha Gervais G 09/17/2016, 7:31 AM

## 2016-09-18 MED ORDER — ACETAMINOPHEN 325 MG PO TABS: 650.0000 mg | ORAL_TABLET | ORAL | Status: DC | PRN

## 2016-09-18 NOTE — Discharge Summary (Signed)
OB Discharge Summary     Patient Name: Eileen [name] DOB: [birth year] MRN: [id number]  Date of admission: 09/16/2016 Delivering MD: Jacklyn Shell   Date of discharge: 09/18/2016  Admitting diagnosis: Term preg; contractions Intrauterine pregnancy: [redacted]w[redacted]d     Secondary diagnosis:  Prev C/S w/ VBAC x 5; AMA  Additional problems: no prenatal care     Discharge diagnosis: Term Pregnancy Delivered and CHTN                                                                                                Post partum procedures:none  Augmentation: AROM  Complications: None  Hospital course:  Onset of Labor With Vaginal Delivery     41 y.o. yo [id number] at 104w4d was admitted in Active Labor on 09/16/2016. Patient had an uncomplicated labor course as follows:  Membrane Rupture Time/Date: 7:41 PM ,09/16/2016   Intrapartum Procedures: Episiotomy: None [1]                                         Lacerations:  None [1]  Patient had a delivery of a Viable infant. 09/16/2016  Information for the patient's newborn:  Pollie, Poma [[id number]]  Delivery Method: VBAC, Spontaneous (Filed from Delivery Summary)    Pateint had an uncomplicated postpartum course. She was seen by SW while inpt and there are no barriers to d/c. She is ambulating, tolerating a regular diet, passing flatus, and urinating well. Patient is discharged home in stable condition on 09/19/16.    Physical exam Vitals:   09/17/16 0245 09/17/16 0900 09/17/16 1823 09/18/16 0607  BP: 131/75 115/63 (!) 141/97 (!) 115/15  Pulse: 61 74 80 (!) 51  Resp: 18 18 18 17   Temp: 98.7 F (37.1 C) 98.8 F (37.1 C) 98.1 F (36.7 C) 98.2 F (36.8 C)  TempSrc: Oral Oral Oral Oral  Weight:      Height:       General: alert and cooperative Lochia: appropriate Uterine Fundus: firm Incision: N/A DVT Evaluation: No evidence of DVT seen on physical exam. Labs: Lab Results  Component Value Date   WBC 10.4 09/16/2016   HGB 10.7 (L)  09/16/2016   HCT 30.6 (L) 09/16/2016   MCV 78.1 09/16/2016   PLT 225 09/16/2016   CMP Latest Ref Rng & Units 09/17/2016  Glucose 65 - 99 mg/dL 91  BUN 6 - 20 mg/dL 6  Creatinine 3.08 - 6.57 mg/dL 8.46  Sodium 962 - 952 mmol/L 136  Potassium 3.5 - 5.1 mmol/L 3.5  Chloride 101 - 111 mmol/L 106  CO2 22 - 32 mmol/L 24  Calcium 8.9 - 10.3 mg/dL 8.3(L)  Total Protein 6.5 - 8.1 g/dL -  Total Bilirubin 0.3 - 1.2 mg/dL -  Alkaline Phos 38 - 841 U/L -  AST 15 - 41 U/L -  ALT 14 - 54 U/L -   UDS: negative  Discharge instruction: per After Visit Summary and "Baby and Me Booklet".  After visit meds:  Allergies  as of 09/18/2016      Reactions   Penicillins Shortness Of Breath, Swelling   Has patient had a PCN reaction causing immediate rash, facial/tongue/throat swelling, SOB or lightheadedness with hypotension: Yes Has patient had a PCN reaction causing severe rash involving mucus membranes or skin necrosis: Yes Has patient had a PCN reaction that required hospitalization No Has patient had a PCN reaction occurring within the last 10 years: No If all of the above answers are "NO", then may proceed with Cephalosporin use.      Medication List    TAKE these medications   acetaminophen 325 MG tablet Commonly known as:  TYLENOL Take 2 tablets (650 mg total) by mouth every 4 (four) hours as needed (for pain scale < 4).       Diet: routine diet  Activity: Advance as tolerated. Pelvic rest for 6 weeks.   Outpatient follow up:2 weeks- for a preop BTL visit with MD Follow up Appt:Future Appointments Date Time Provider Department Center  10/28/2016 1:20 PM Judeth HornErin Lawrence, NP [facility]   Follow up Visit:No Follow-up on file.  Postpartum contraception: Tubal Ligation- papers signed while inpatient  Newborn Data: Live born female  Birth Weight: 7 lb 4.2 oz (3294 g) APGAR: 8, 9  Baby Feeding: Bottle and Breast Disposition:home with mother   09/18/2016 Cam HaiSHAW, Aleigh Grunden, CNM

## 2016-09-18 NOTE — Plan of Care (Signed)
Problem: Education: Goal: Knowledge of condition will improve Discharge education reviewed with patient along with support phone numbers and when to call the doctor. Patient verbalizes understanding of information.

## 2016-09-18 NOTE — Discharge Instructions (Signed)

## 2016-09-20 LAB — GC/CHLAMYDIA PROBE AMP (~~LOC~~) NOT AT ARMC
Chlamydia: NEGATIVE
Neisseria Gonorrhea: NEGATIVE

## 2016-10-28 ENCOUNTER — Ambulatory Visit: Payer: Medicaid Other | Admitting: Student

## 2016-12-05 ENCOUNTER — Encounter (HOSPITAL_COMMUNITY): Payer: Self-pay | Admitting: *Deleted

## 2016-12-05 ENCOUNTER — Ambulatory Visit (HOSPITAL_COMMUNITY)
Admission: EM | Admit: 2016-12-05 | Discharge: 2016-12-05 | Disposition: A | Discharge status: Home / Self Care | Payer: Medicaid Other | Attending: Family Medicine | Admitting: Family Medicine

## 2016-12-05 DIAGNOSIS — K0889 Other specified disorders of teeth and supporting structures: Secondary | ICD-10-CM

## 2016-12-05 DIAGNOSIS — K029 Dental caries, unspecified: Secondary | ICD-10-CM | POA: Diagnosis not present

## 2016-12-05 MED ORDER — CLINDAMYCIN HCL 150 MG PO CAPS: 150.0000 mg | ORAL_CAPSULE | Freq: Four times a day (QID) | ORAL | 0 refills | Status: DC

## 2016-12-05 MED ORDER — HYDROCODONE-ACETAMINOPHEN 5-325 MG PO TABS: 1.0000 | ORAL_TABLET | Freq: Four times a day (QID) | ORAL | 0 refills | Status: DC | PRN

## 2016-12-05 NOTE — ED Triage Notes (Signed)
C/O bottom right toothache onset today.  Pt tearful, states pain worsening.  Has been taking Tyl without significant relief.

## 2016-12-05 NOTE — ED Provider Notes (Signed)
[facility]    CSN: [id number] Arrival date & time: 12/05/16  1201     History   Chief Complaint Chief Complaint  Patient presents with  . Dental Pain    HPI Eileen [name] is a 41 y.o. female.   This 41 year old woman who complains of dental pain.  The pain began yesterday in tooth #30 is gotten worse today. The Tylenol she has taken has helped a little bit.  Patient works in a nursing home. She has Medicaid but does not have a dentist.      Past Medical History:  Diagnosis Date  . Hypertension    has appt set up with PCP; not currently on meds  . Molar pregnancy   . Panic attacks    " a long time ago"  . Stillbirth   . Trichomonas     Patient Active Problem List   Diagnosis Date Noted  . Normal labor 09/16/2016  . Supervision of normal pregnancy 08/02/2016  . 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 Bleeding in early pregnancy 02/23/2016  . Trichomonas vaginalis (TV) infection 02/23/2016  . Chronic hypertension during pregnancy, antepartum 02/23/2016  . AMA (advanced maternal age) multigravida 35+ 02/23/2016  . 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 History of stillbirth 12/06/2012  . Chlamydia infection, current pregnancy 11/16/2012  . Insufficient prenatal care 11/13/2012  . Urinary retention with incomplete bladder emptying 06/16/2012    Past Surgical History:  Procedure Laterality Date  . CESAREAN SECTION    . DILATION AND CURETTAGE OF UTERUS      OB History    Gravida Para Term Preterm AB Living   11 8 7 1 2 8    SAB TAB Ectopic Multiple Live Births   2 0 0 0 7       Home Medications    Prior to Admission medications   Medication Sig Start Date End Date Taking? Authorizing Provider  acetaminophen (TYLENOL) 325 MG tablet Take 2 tablets (650 mg total) by mouth every 4 (four) hours as needed (for pain scale < 4). 09/18/16  Yes Arabella MerlesKimberly D Shaw, CNM  clindamycin (CLEOCIN) 150 MG capsule Take 1 capsule (150 mg total) by mouth every 6 (six) hours.  12/05/16   Elvina SidleKurt Zen Felling, MD  HYDROcodone-acetaminophen (NORCO) 5-325 MG tablet Take 1 tablet by mouth every 6 (six) hours as needed for moderate pain. 12/05/16   Elvina SidleKurt Media Pizzini, MD    Family History Family History  Problem Relation Age of Onset  . Hypertension Maternal Grandfather   . Depression Cousin   . Alcohol abuse Neg Hx   . Arthritis Neg Hx   . Asthma Neg Hx   . Birth defects Neg Hx   . Cancer Neg Hx   . COPD Neg Hx   . Diabetes Neg Hx   . Drug abuse Neg Hx   . Early death Neg Hx   . Hearing loss Neg Hx   . Hyperlipidemia Neg Hx   . Heart disease Neg Hx   . Kidney disease Neg Hx   . Learning disabilities Neg Hx   . Mental illness Neg Hx   . Mental retardation Neg Hx   . Miscarriages / Stillbirths Neg Hx   . Stroke Neg Hx   . Vision loss Neg Hx     Social History Social History  Substance Use Topics  . Smoking status: Never Smoker  . Smokeless tobacco: Never Used  . Alcohol use No     Allergies   Penicillins  Review of Systems Review of Systems  Constitutional: Negative.   HENT: Positive for dental problem.   All other systems reviewed and are negative.    Physical Exam Triage Vital Signs ED Triage Vitals  Enc Vitals Group     BP 12/05/16 1209 (!) 165/102     Pulse Rate 12/05/16 1209 (!) 107     Resp 12/05/16 1209 18     Temp 12/05/16 1209 98.2 F (36.8 C)     Temp Source 12/05/16 1209 Oral     SpO2 12/05/16 1209 97 %     Weight --      Height --      Head Circumference --      Peak Flow --      Pain Score 12/05/16 1211 10     Pain Loc --      Pain Edu? --      Excl. in GC? --    No data found.   Updated Vital Signs BP (!) 165/102   Pulse (!) 107   Temp 98.2 F (36.8 C) (Oral)   Resp 18   LMP 11/25/2016   SpO2 97%   Breastfeeding? No    Physical Exam  Constitutional: She is oriented to person, place, and time. She appears well-developed and well-nourished.  HENT:  Head: Normocephalic.  Right Ear: External ear normal.    Left Ear: External ear normal.  Mouth/Throat: Oropharynx is clear and moist.  Multiple dental caries, tooth #30 is been eroded down to the roots with some gum swelling  Eyes: Conjunctivae and EOM are normal. Pupils are equal, round, and reactive to light.  Neck: Normal range of motion. Neck supple.  Pulmonary/Chest: Effort normal.  Musculoskeletal: Normal range of motion.  Neurological: She is alert and oriented to person, place, and time.  Skin: Skin is warm and dry.  Nursing note and vitals reviewed.    UC Treatments / Results  Labs (all labs ordered are listed, but only abnormal results are displayed) Labs Reviewed - No data to display  EKG  EKG Interpretation None       Radiology No results found.  Procedures Procedures (including critical care time)  Medications Ordered in UC Medications - No data to display   Initial Impression / Assessment and Plan / UC Course  I have reviewed the triage vital signs and the nursing notes.  Pertinent labs & imaging results that were available during my care of the patient were reviewed by me and considered in my medical decision making (see chart for details).      Final Clinical Impressions(s) / UC Diagnoses   Final diagnoses:  Pain, dental  Dental caries    New Prescriptions New Prescriptions   CLINDAMYCIN (CLEOCIN) 150 MG CAPSULE    Take 1 capsule (150 mg total) by mouth every 6 (six) hours.   HYDROCODONE-ACETAMINOPHEN (NORCO) 5-325 MG TABLET    Take 1 tablet by mouth every 6 (six) hours as needed for moderate pain.     Elvina Sidle, MD 12/05/16 1218

## 2016-12-24 ENCOUNTER — Emergency Department (HOSPITAL_COMMUNITY)
Admission: EM | Admit: 2016-12-24 | Discharge: 2016-12-24 | Disposition: A | Discharge status: Home / Self Care | Payer: Medicaid Other | Attending: Emergency Medicine | Admitting: Emergency Medicine

## 2016-12-24 ENCOUNTER — Emergency Department (HOSPITAL_COMMUNITY): Payer: Medicaid Other

## 2016-12-24 ENCOUNTER — Encounter (HOSPITAL_COMMUNITY): Payer: Self-pay | Admitting: *Deleted

## 2016-12-24 DIAGNOSIS — M25562 Pain in left knee: Secondary | ICD-10-CM

## 2016-12-24 DIAGNOSIS — X58XXXA Exposure to other specified factors, initial encounter: Secondary | ICD-10-CM | POA: Diagnosis not present

## 2016-12-24 DIAGNOSIS — Y9289 Other specified places as the place of occurrence of the external cause: Secondary | ICD-10-CM | POA: Insufficient documentation

## 2016-12-24 DIAGNOSIS — Y999 Unspecified external cause status: Secondary | ICD-10-CM | POA: Insufficient documentation

## 2016-12-24 DIAGNOSIS — Y9389 Activity, other specified: Secondary | ICD-10-CM | POA: Diagnosis not present

## 2016-12-24 MED ORDER — OXYCODONE-ACETAMINOPHEN 5-325 MG PO TABS
ORAL_TABLET | ORAL | Status: AC
  Filled 2016-12-24: qty 1

## 2016-12-24 MED ORDER — NAPROXEN 500 MG PO TABS: 500.0000 mg | ORAL_TABLET | Freq: Two times a day (BID) | ORAL | 0 refills | Status: DC

## 2016-12-24 MED ORDER — OXYCODONE-ACETAMINOPHEN 5-325 MG PO TABS
1.0000 | ORAL_TABLET | ORAL | Status: DC | PRN
  Administered 2016-12-24: 1 via ORAL

## 2016-12-24 NOTE — ED Triage Notes (Signed)
Pt reports L knee pain onset today while at work, denies injury, denies swelling, pt tearful, pt able to bear weight with pain, A&O x4

## 2016-12-24 NOTE — ED Notes (Signed)
Papers reviewed with patient and follow up discussed. Pt verbalizes understanding and expresses intent to follow up with ortho.

## 2016-12-24 NOTE — ED Provider Notes (Signed)
[facility]   CSN: [id number] Arrival date & time: 12/24/16  1115   By signing my name below, I, Eileen [name], attest that this documentation has been prepared under the direction and in the presence of Eileen Truxillo, PA-C. Electronically Signed: Orpah [name] , ED Scribe. 12/24/16. 4:10 PM.    History   Chief Complaint Chief Complaint  Patient presents with  . Knee Pain    HPI Eileen [name] is a 41 y.o. female who presents to the Emergency Department complaining of intermittent, mild L knee pain with onset x2 weeks. Pt states that today while at work, she bent down and started experiencing L knee pain that prevents her from ambulating. Pt has taken tylenol with no relief. She was administered oxycodone in the ED with relief of the pain. Pt denies numbness, tingling, fever, L knee swelling.Of note, she states that she wears a knee race daily for support but did not wear the brace today. Pt reports known allergy to penicillin.    The history is provided by the patient. No language interpreter was used.    Past Medical History:  Diagnosis Date  . Hypertension    has appt set up with PCP; not currently on meds  . Molar pregnancy   . Panic attacks    " a long time ago"  . Stillbirth   . Trichomonas     Patient Active Problem List   Diagnosis Date Noted  . Normal labor 09/16/2016  . Supervision of normal pregnancy 08/02/2016  . 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 Bleeding in early pregnancy 02/23/2016  . Trichomonas vaginalis (TV) infection 02/23/2016  . Chronic hypertension during pregnancy, antepartum 02/23/2016  . AMA (advanced maternal age) multigravida 35+ 02/23/2016  . 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 History of stillbirth 12/06/2012  . Chlamydia infection, current pregnancy 11/16/2012  . Insufficient prenatal care 11/13/2012  . Urinary retention with incomplete bladder emptying 06/16/2012    Past Surgical History:  Procedure Laterality  Date  . CESAREAN SECTION    . DILATION AND CURETTAGE OF UTERUS      OB History    Gravida Para Term Preterm AB Living   SAB TAB Ectopic Multiple Live Births   2 0 0 0 7       Home Medications    Prior to Admission medications   Medication Sig Start Date End Date Taking? Authorizing Provider  acetaminophen (TYLENOL) 325 MG tablet Take 2 tablets (650 mg total) by mouth every 4 (four) hours as needed (for pain scale < 4). 09/18/16   Arabella Merles, CNM  clindamycin (CLEOCIN) 150 MG capsule Take 1 capsule (150 mg total) by mouth every 6 (six) hours. 12/05/16   Elvina Sidle, MD  HYDROcodone-acetaminophen (NORCO) 5-325 MG tablet Take 1 tablet by mouth every 6 (six) hours as needed for moderate pain. 12/05/16   Elvina Sidle, MD  naproxen (NAPROSYN) 500 MG tablet Take 1 tablet (500 mg total) by mouth 2 (two) times daily. 12/24/16   Dietrich Pates, PA-C    Family History Family History  Problem Relation Age of Onset  . Hypertension Maternal Grandfather   . Depression Cousin   . Alcohol abuse Neg Hx   . Arthritis Neg Hx   . Asthma Neg Hx   . Birth defects Neg Hx   . Cancer Neg Hx   . COPD Neg Hx   . Diabetes Neg Hx   . Drug abuse Neg  Hx   . Early death Neg Hx   . Hearing loss Neg Hx   . Hyperlipidemia Neg Hx   . Heart disease Neg Hx   . Kidney disease Neg Hx   . Learning disabilities Neg Hx   . Mental illness Neg Hx   . Mental retardation Neg Hx   . Miscarriages / Stillbirths Neg Hx   . Stroke Neg Hx   . Vision loss Neg Hx     Social History Social History  Substance Use Topics  . Smoking status: Never Smoker  . Smokeless tobacco: Never Used  . Alcohol use No     Allergies   Penicillins   Review of Systems Review of Systems  Constitutional: Negative for fever.  Musculoskeletal: Positive for arthralgias (L knee). Negative for joint swelling.     Physical Exam Updated Vital Signs BP (!) 148/105 (BP Location: Left Arm)   Pulse 77   Temp 98.2  F (36.8 C) (Oral)   Resp 18   LMP 12/24/2016   SpO2 99%   Physical Exam  Constitutional: She appears well-developed and well-nourished. No distress.  HENT:  Head: Normocephalic and atraumatic.  Eyes: Conjunctivae are normal.  Neck: Neck supple.  Cardiovascular: Normal rate and regular rhythm.   No murmur heard. Pulmonary/Chest: Effort normal and breath sounds normal. No respiratory distress.  Abdominal: Soft. There is no tenderness.  Musculoskeletal: She exhibits no edema.       Left knee: She exhibits swelling. Tenderness found.  Swelling on the lateral aspect of the L patella and TTP. She has pain with flexion of the L knee.   Neurological: She is alert.  Skin: Skin is warm and dry.  Psychiatric: She has a normal mood and affect.  Nursing note and vitals reviewed.    ED Treatments / Results   DIAGNOSTIC STUDIES: Oxygen Saturation is 99% on RA, normal by my interpretation.   COORDINATION OF CARE: 4:10 PM-Discussed next steps with pt. Pt verbalized understanding and is agreeable with the plan.    Labs (all labs ordered are listed, but only abnormal results are displayed) Labs Reviewed - No data to display  EKG  EKG Interpretation None       Radiology Dg Knee Complete 4 Views Left  Result Date: 12/24/2016 CLINICAL DATA:  Left knee pain. EXAM: LEFT KNEE - COMPLETE 4+ VIEW COMPARISON:  None. FINDINGS: No evidence of fracture or dislocation. Moderate joint effusion. No evidence of arthropathy or other focal bone abnormality. Soft tissues are unremarkable. IMPRESSION: Moderate joint effusion.  Otherwise, normal exam. Electronically Signed   By: Francene Boyers M.D.   On: 12/24/2016 12:19    Procedures Procedures (including critical care time)  Medications Ordered in ED Medications  oxyCODONE-acetaminophen (PERCOCET/ROXICET) 5-325 MG per tablet 1 tablet (1 tablet Oral Given 12/24/16 1137)  oxyCODONE-acetaminophen (PERCOCET/ROXICET) 5-325 MG per tablet (not  administered)     Initial Impression / Assessment and Plan / ED Course  I have reviewed the triage vital signs and the nursing notes.  Pertinent labs & imaging results that were available during my care of the patient were reviewed by me and considered in my medical decision making (see chart for details).     Patient's history and symptoms concerning for muscle strain causing effusion. Patient states difficulty bearing weight but able to bend knee. Area does not appear to be infected, with no temperature change, warmth or signs of trauma. Pateitn is afebrile. She has no history of gout. No concern for DVT  based on presentation. X-ray showed moderate joint effusion. Educated on why we do not tap these in the ED for her case (ie: no concern for infection or gout). She will need to follow up with ortho for further evaluation. Given knee sleeve here and rx anti-inflammatories. Return precautions given.  Final Clinical Impressions(s) / ED Diagnoses   Final diagnoses:  Left knee pain, unspecified chronicity    New Prescriptions Discharge Medication List as of 12/24/2016  2:23 PM    START taking these medications   Details  naproxen (NAPROSYN) 500 MG tablet Take 1 tablet (500 mg total) by mouth 2 (two) times daily., Starting Fri 12/24/2016, Print       I personally performed the services described in this documentation, which was scribed in my presence. The recorded information has been reviewed and is accurate.     Dietrich Pates, PA-C 12/24/16 1610    Tilden Fossa, MD 12/25/16 [id number]

## 2016-12-24 NOTE — Discharge Instructions (Signed)
Begin taking naproxen twice daily for pain. Use knee sleeve for support. Rest, ice and elevate extremity as much as possible. Follow up with ortho for further evaluation. Return to ED for worsening pain, additional injury, fever, warmth of joint, trouble walking or numbness/tingling.

## 2017-08-26 ENCOUNTER — Other Ambulatory Visit: Payer: Self-pay

## 2017-08-26 ENCOUNTER — Emergency Department (HOSPITAL_COMMUNITY)
Admission: EM | Admit: 2017-08-26 | Discharge: 2017-08-26 | Disposition: A | Discharge status: Home / Self Care | Payer: Self-pay | Attending: Emergency Medicine | Admitting: Emergency Medicine

## 2017-08-26 ENCOUNTER — Encounter (HOSPITAL_COMMUNITY): Payer: Self-pay | Admitting: Emergency Medicine

## 2017-08-26 DIAGNOSIS — K0889 Other specified disorders of teeth and supporting structures: Secondary | ICD-10-CM | POA: Insufficient documentation

## 2017-08-26 DIAGNOSIS — I10 Essential (primary) hypertension: Secondary | ICD-10-CM | POA: Insufficient documentation

## 2017-08-26 MED ORDER — OXYCODONE-ACETAMINOPHEN 5-325 MG PO TABS
1.0000 | ORAL_TABLET | Freq: Once | ORAL | Status: AC
  Administered 2017-08-26: 1 via ORAL
  Filled 2017-08-26: qty 1

## 2017-08-26 MED ORDER — CLINDAMYCIN HCL 150 MG PO CAPS: 450.0000 mg | ORAL_CAPSULE | Freq: Three times a day (TID) | ORAL | 0 refills | Status: AC

## 2017-08-26 MED ORDER — NAPROXEN 500 MG PO TABS: 500.0000 mg | ORAL_TABLET | Freq: Two times a day (BID) | ORAL | 0 refills | Status: DC

## 2017-08-26 NOTE — ED Triage Notes (Signed)
Pt states 2 days of left lower dental pain, tooth has black area to it. PT needs dental referals and needs some resources. Pt also needs something for pain, took tylenol with no relief.

## 2017-08-26 NOTE — Discharge Instructions (Signed)
Call one of the dentists offices provided to schedule an appointment for re-evaluation and further management within the next 48 hours.   I have prescribed you Clindamycin which is an antibiotic to treat the infection and Naproxen which is an anti-inflammatory medicine to treat the pain.   Please take all of your antibiotics until finished. You may develop abdominal discomfort or diarrhea from the antibiotic.  You may help offset this with probiotics which you can buy at the store (ask your pharmacist if unable to find) or get probiotics in the form of eating yogurt. Do not eat or take the probiotics until 2 hours after your antibiotic. If you are unable to tolerate these side effects follow-up with your primary care provider or return to the emergency department.   If you begin to experience any blistering, rashes, swelling, or difficulty breathing seek medical care for evaluation of potentially more serious side effects.   Be sure to eat something when taking the Naproxen as it can cause stomach upset and at worst stomach bleeding. Do not take additional non steroidal anti-inflammatory medicines such as Ibuprofen, Aleve, or Advil while taking Naproxen. You may supplement with Tylenol.   Please be aware that this medications may interact with other medications you are taking, please be sure to discuss your medication list with your pharmacist. If you are taking birth control the antibiotic will deactivate your birth control for 2 weeks.   If you start to experience and new or worsening symptoms return to the emergency department. If you start to experience fever, chills, neck stiffness/pain, or inability to move your neck come back to the emergency department immediately.

## 2017-08-26 NOTE — ED Provider Notes (Signed)
[facility] Provider Note   CSN: [id number] Arrival date & time: 08/26/17  [id number]     History   Chief Complaint Chief Complaint  Patient presents with  . Dental Pain    HPI Eileen [name] is a 41 y.o. female with a hx of hypertension who presents to the emergency department complaining of left lower dental pain that started last night.  Patient states that the pain is constant and achy, worse with pressure. Tried Tylenol without relief.  No alleviating factors.  States that the tooth has been cracked for extended period of time, did not crack it last night.  Denies fever, chills, neck pain, or discharge from the area of dental pain.  HPI  Past Medical History:  Diagnosis Date  . Hypertension    has appt set up with PCP; not currently on meds  . Molar pregnancy   . Panic attacks    " a long time ago"  . Stillbirth   . Trichomonas     Patient Active Problem List   Diagnosis Date Noted  . Normal labor 09/16/2016  . Supervision of normal pregnancy 08/02/2016  . 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 Bleeding in early pregnancy 02/23/2016  . Trichomonas vaginalis (TV) infection 02/23/2016  . Chronic hypertension during pregnancy, antepartum 02/23/2016  . AMA (advanced maternal age) multigravida 35+ 02/23/2016  . 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 History of stillbirth 12/06/2012  . Chlamydia infection, current pregnancy 11/16/2012  . Insufficient prenatal care 11/13/2012  . Urinary retention with incomplete bladder emptying 06/16/2012    Past Surgical History:  Procedure Laterality Date  . CESAREAN SECTION    . DILATION AND CURETTAGE OF UTERUS      OB History    Gravida Para Term Preterm AB Living   11 8 7 1 2 8    SAB TAB Ectopic Multiple Live Births   2 0 0 0 7       Home Medications    Prior to Admission medications   Medication Sig Start Date End Date Taking? Authorizing Provider  acetaminophen (TYLENOL) 325 MG tablet  Take 2 tablets (650 mg total) by mouth every 4 (four) hours as needed (for pain scale < 4). 09/18/16   Arabella MerlesShaw, Kimberly D, CNM  clindamycin (CLEOCIN) 150 MG capsule Take 3 capsules (450 mg total) by mouth 3 (three) times daily for 7 days. 08/26/17 09/02/17  Raychell Holcomb, Pleas KochSamantha R, PA-C  HYDROcodone-acetaminophen (NORCO) 5-325 MG tablet Take 1 tablet by mouth every 6 (six) hours as needed for moderate pain. 12/05/16   Elvina SidleLauenstein, Kurt, MD  naproxen (NAPROSYN) 500 MG tablet Take 1 tablet (500 mg total) by mouth 2 (two) times daily. 08/26/17   Eretria Manternach, Pleas KochSamantha R, PA-C    Family History Family History  Problem Relation Age of Onset  . Hypertension Maternal Grandfather   . Depression Cousin   . Alcohol abuse Neg Hx   . Arthritis Neg Hx   . Asthma Neg Hx   . Birth defects Neg Hx   . Cancer Neg Hx   . COPD Neg Hx   . Diabetes Neg Hx   . Drug abuse Neg Hx   . Early death Neg Hx   . Hearing loss Neg Hx   . Hyperlipidemia Neg Hx   . Heart disease Neg Hx   . Kidney disease Neg Hx   . Learning disabilities Neg Hx   . Mental illness Neg Hx   . Mental retardation Neg Hx   .  Miscarriages / Stillbirths Neg Hx   . Stroke Neg Hx   . Vision loss Neg Hx     Social History Social History   Tobacco Use  . Smoking status: Never Smoker  . Smokeless tobacco: Never Used  Substance Use Topics  . Alcohol use: No  . Drug use: No     Allergies   Penicillins   Review of Systems Review of Systems  Constitutional: Negative for chills and fever.  HENT: Positive for dental problem. Negative for congestion, drooling, ear pain, rhinorrhea and sore throat.   Respiratory: Negative for shortness of breath.   Cardiovascular: Negative for chest pain.  Gastrointestinal: Negative for abdominal pain and vomiting.  Musculoskeletal: Negative for neck pain and neck stiffness.  Neurological: Negative for dizziness, syncope, weakness, light-headedness, numbness and headaches.    Physical Exam Updated Vital  Signs BP (!) 154/105 (BP Location: Right Arm)   Pulse 89   Temp 98.3 F (36.8 C) (Oral)   Resp 20   SpO2 96%   Physical Exam  Constitutional: She appears well-developed and well-nourished.  Non-toxic appearance.  Patient appears uncomfortable secondary to pain  HENT:  Head: Normocephalic and atraumatic.  Right Ear: Tympanic membrane is not erythematous, not retracted and not bulging.  Left Ear: Tympanic membrane is not erythematous, not retracted and not bulging.  Nose: Nose normal.  Mouth/Throat: Uvula is midline and oropharynx is clear and moist. No trismus in the jaw. No dental abscesses or uvula swelling.    Eyes: Conjunctivae are normal. Pupils are equal, round, and reactive to light. Right eye exhibits no discharge. Left eye exhibits no discharge.  Neck: Full passive range of motion without pain. Neck supple.  Pulmonary/Chest: No respiratory distress.  Lymphadenopathy:    She has no cervical adenopathy.  Neurological: She is alert.  Clear speech.   Psychiatric: She has a normal mood and affect. Her behavior is normal. Thought content normal.  Nursing note and vitals reviewed.    ED Treatments / Results  Labs (all labs ordered are listed, but only abnormal results are displayed) Labs Reviewed - No data to display  EKG  EKG Interpretation None      Radiology No results found.  Procedures Procedures (including critical care time)  Medications Ordered in ED Medications  oxyCODONE-acetaminophen (PERCOCET/ROXICET) 5-325 MG per tablet 1 tablet (not administered)    Initial Impression / Assessment and Plan / ED Course  I have reviewed the triage vital signs and the nursing notes.  Pertinent labs & imaging results that were available during my care of the patient were reviewed by me and considered in my medical decision making (see chart for details).    Patient presents with dental pain. Patient is nontoxic appearing with stable vital signs- however noted to be  somewhat hypertensive.  No gross abscess.  Exam unconcerning for Ludwig's angina or spread of infection- no signs of PTA/RPA. Will give 1 dose of percocet in the ED given patient is not driving and appears uncomfortable. Will treat with Clindamycin and Naproxen.  Urged patient to follow-up with dentist, dental resources were provided. Patient's blood pressure elevated in the emergency department today. Patient denies headache, change in vision, numbness, weakness, chest pain, dyspnea, dizziness, or lightheadedness therefore doubt hypertensive emergency. Discussed elevated blood pressure with the patient and the need for primary care follow up with potential need to initiate or change antihypertensive medications and or for further evaluation. Discussed return precaution signs/symptoms for hypertensive emergency as listed above with the patient.  Discussed treatment plan and need for follow up as well as return precautions in regards to dental pain and BP. Provided opportunity for questions, patient confirmed understanding and is agreeable to plan.  Vitals:   08/26/17 0841 08/26/17 1037  BP: (!) 154/105 (S) (!) 141/104  Pulse: 89   Resp: 20 20  Temp: 98.3 F (36.8 C)   SpO2: 96% 96%    Final Clinical Impressions(s) / ED Diagnoses   Final diagnoses:  Pain, dental    ED Discharge Orders        Ordered    naproxen (NAPROSYN) 500 MG tablet  2 times daily     08/26/17 1027    clindamycin (CLEOCIN) 150 MG capsule  3 times daily     08/26/17 [address], [place] R, PA-C 08/26/17 1039    Lavera Guise, MD 08/26/17 1616

## 2017-12-01 ENCOUNTER — Emergency Department (HOSPITAL_COMMUNITY): Admission: EM | Admit: 2017-12-01 | Discharge: 2017-12-01 | Discharge status: Against Medical Advice | Payer: Self-pay

## 2017-12-02 ENCOUNTER — Encounter (HOSPITAL_COMMUNITY): Payer: Self-pay | Admitting: *Deleted

## 2017-12-02 ENCOUNTER — Emergency Department (HOSPITAL_COMMUNITY)
Admission: EM | Admit: 2017-12-02 | Discharge: 2017-12-02 | Disposition: A | Discharge status: Home / Self Care | Payer: Self-pay | Attending: Emergency Medicine | Admitting: Emergency Medicine

## 2017-12-02 ENCOUNTER — Other Ambulatory Visit: Payer: Self-pay

## 2017-12-02 DIAGNOSIS — K0889 Other specified disorders of teeth and supporting structures: Secondary | ICD-10-CM | POA: Insufficient documentation

## 2017-12-02 DIAGNOSIS — R030 Elevated blood-pressure reading, without diagnosis of hypertension: Secondary | ICD-10-CM

## 2017-12-02 DIAGNOSIS — I10 Essential (primary) hypertension: Secondary | ICD-10-CM | POA: Insufficient documentation

## 2017-12-02 MED ORDER — CLINDAMYCIN HCL 150 MG PO CAPS: 300.0000 mg | ORAL_CAPSULE | Freq: Four times a day (QID) | ORAL | 0 refills | Status: DC

## 2017-12-02 NOTE — Discharge Instructions (Signed)
Take ibuprofen or tylenol for pain. You can take your pain medication as well. Clindamycin as prescribed until all gone for infection. See dental guide to find a dentist. Follow up with them. Your blood pressure is high in ED. See attacked phone number to help you find a primary care doctor.

## 2017-12-02 NOTE — ED Triage Notes (Signed)
Pt has left lower dental pain. Airway intact.

## 2017-12-02 NOTE — ED Provider Notes (Signed)
[facility] EMERGENCY DEPARTMENT Provider Note   CSN: [id number] Arrival date & time: 12/02/17  1914     History   Chief Complaint Chief Complaint  Patient presents with  . Dental Pain    HPI Eileen [name] is a 42 y.o. female.  HPI Eileen [name] is a 42 y.o. female presents emergency department complaining of dental pain.  Patient states she has a bad tooth to the left lower and upper gums, states has developed pain and bleeding yesterday while at work.  Patient was sent home from work and was told to come to emergency department.  She states she came by the ER yesterday but it was too busy so she left without being seen.  Today pain has gotten worse, she again missed work, and was told that she needed a work note so she came here for evaluation.  She does report mild facial swelling.  Denies any more bleeding.  Denies any fever or chills.  No dental injuries..  She states that she has some "pain pills" at home that she has been taking which helps.  She states "I think I just need some antibiotics.  She does not have a dentist.  Blood pressure noted to be elevated, states she does not have a primary care doctor.  Past Medical History:  Diagnosis Date  . Hypertension    has appt set up with PCP; not currently on meds  . Molar pregnancy   . Panic attacks    " a long time ago"  . Stillbirth   . Trichomonas     Patient Active Problem List   Diagnosis Date Noted  . Normal labor 09/16/2016  . Supervision of normal pregnancy 08/02/2016  . 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 Bleeding in early pregnancy 02/23/2016  . Trichomonas vaginalis (TV) infection 02/23/2016  . Chronic hypertension during pregnancy, antepartum 02/23/2016  . AMA (advanced maternal age) multigravida 35+ 02/23/2016  . 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 History of stillbirth 12/06/2012  . Chlamydia infection, current pregnancy 11/16/2012  . Insufficient prenatal care 11/13/2012  . Urinary  retention with incomplete bladder emptying 06/16/2012    Past Surgical History:  Procedure Laterality Date  . CESAREAN SECTION    . DILATION AND CURETTAGE OF UTERUS      OB History    Gravida  11   Para  8   Term  7   Preterm  1   AB  2   Living  8     SAB  2   TAB  0   Ectopic  0   Multiple  0   Live Births  7            Home Medications    Prior to Admission medications   Medication Sig Start Date End Date Taking? Authorizing Provider  acetaminophen (TYLENOL) 325 MG tablet Take 2 tablets (650 mg total) by mouth every 4 (four) hours as needed (for pain scale < 4). 09/18/16   Arabella Merles, CNM  clindamycin (CLEOCIN) 150 MG capsule Take 2 capsules (300 mg total) by mouth every 6 (six) hours. 12/02/17   Tonye Tancredi, Lemont Fillers, PA-C  HYDROcodone-acetaminophen (NORCO) 5-325 MG tablet Take 1 tablet by mouth every 6 (six) hours as needed for moderate pain. Patient not taking: Reported on 08/26/2017 12/05/16   Elvina Sidle, MD  naproxen (NAPROSYN) 500 MG tablet Take 1 tablet (500 mg total) by mouth 2 (two) times daily. 08/26/17   Petrucelli, Pleas Koch,  PA-C    Family History Family History  Problem Relation Age of Onset  . Hypertension Maternal Grandfather   . Depression Cousin   . Alcohol abuse Neg Hx   . Arthritis Neg Hx   . Asthma Neg Hx   . Birth defects Neg Hx   . Cancer Neg Hx   . COPD Neg Hx   . Diabetes Neg Hx   . Drug abuse Neg Hx   . Early death Neg Hx   . Hearing loss Neg Hx   . Hyperlipidemia Neg Hx   . Heart disease Neg Hx   . Kidney disease Neg Hx   . Learning disabilities Neg Hx   . Mental illness Neg Hx   . Mental retardation Neg Hx   . Miscarriages / Stillbirths Neg Hx   . Stroke Neg Hx   . Vision loss Neg Hx     Social History Social History   Tobacco Use  . Smoking status: Never Smoker  . Smokeless tobacco: Never Used  Substance Use Topics  . Alcohol use: No  . Drug use: No     Allergies   Penicillins   Review  of Systems Review of Systems  Constitutional: Negative for chills and fever.  HENT: Positive for dental problem and facial swelling. Negative for sore throat and trouble swallowing.   Respiratory: Negative for cough, chest tightness and shortness of breath.   Cardiovascular: Negative for chest pain, palpitations and leg swelling.  Musculoskeletal: Negative for arthralgias, myalgias, neck pain and neck stiffness.  Skin: Negative for rash.  Neurological: Negative for dizziness, weakness and headaches.  All other systems reviewed and are negative.    Physical Exam Updated Vital Signs BP (!) 170/112 (BP Location: Right Arm)   Pulse (!) 105   Temp 98.3 F (36.8 C) (Oral)   Resp 17   Ht 5\' 5"  (1.651 m)   SpO2 100%   BMI 31.12 kg/m   Physical Exam  Constitutional: She appears well-developed and well-nourished. No distress.  HENT:  Large caries to the left lower second molar and left upper third molar.  There is no obvious facial swelling.  There is tenderness to palpation over those teeth.  There is no trismus.  No swelling under the tongue.  No lymphadenopathy noted.  Eyes: Conjunctivae are normal.  Neck: Normal range of motion. Neck supple.  Cardiovascular: Normal rate, regular rhythm and normal heart sounds.  Pulmonary/Chest: Effort normal and breath sounds normal.  Neurological: She is alert.  Skin: Skin is warm and dry.  Nursing note and vitals reviewed.    ED Treatments / Results  Labs (all labs ordered are listed, but only abnormal results are displayed) Labs Reviewed - No data to display  EKG  EKG Interpretation None       Radiology No results found.  Procedures Procedures (including critical care time)  Medications Ordered in ED Medications - No data to display   Initial Impression / Assessment and Plan / ED Course  I have reviewed the triage vital signs and the nursing notes.  Pertinent labs & imaging results that were available during my care of the  patient were reviewed by me and considered in my medical decision making (see chart for details).     Patient in emergency department with dental pain.  This is a recurrent problem.  No obvious signs and of infection, however patient does have 2 large caries to the left lower second molar and left upper third molars.  There  is significant tenderness over these teeth.  There is no trismus.  No swelling under the tongue.  No concern for Ludwig's angina.  She is afebrile.  She is slightly tachycardic and hypertensive, but states this is probably due to pain.  She has no history of hypertension.  I will give her outpatient resources for dental services as well as a phone number to find a primary care doctor for follow-up.  She states she is currently under a lot of stress, states her significant other is having an open heart surgery today and she has 8 kids that she is currently taking care of by herself.  Certainly her blood pressure could be elevated because of pain and stressors.  I still advised her to have it rechecked in 1 week.  She is otherwise asymptomatic for hypertension.  She is allergic to penicillins, will start on clindamycin for possible dental infection however follow-up with a dentist and pcp  Vitals:   12/02/17 0739  BP: (!) 170/112  Pulse: (!) 105  Resp: 17  Temp: 98.3 F (36.8 C)  TempSrc: Oral  SpO2: 100%  Height: 5\' 5"  (1.651 m)     Final Clinical Impressions(s) / ED Diagnoses   Final diagnoses:  Pain, dental  Elevated blood pressure reading    ED Discharge Orders        Ordered    clindamycin (CLEOCIN) 150 MG capsule  Every 6 hours     12/02/17 0800       Jaynie Crumble, PA-C 12/02/17 1610    Marily Memos, MD 12/02/17 1649

## 2018-02-04 ENCOUNTER — Emergency Department (HOSPITAL_COMMUNITY)
Admission: EM | Admit: 2018-02-04 | Discharge: 2018-02-04 | Disposition: A | Discharge status: Home / Self Care | Payer: Self-pay | Attending: Emergency Medicine | Admitting: Emergency Medicine

## 2018-02-04 ENCOUNTER — Encounter (HOSPITAL_COMMUNITY): Payer: Self-pay | Admitting: Emergency Medicine

## 2018-02-04 ENCOUNTER — Emergency Department (HOSPITAL_COMMUNITY)
Admission: EM | Admit: 2018-02-04 | Discharge: 2018-02-05 | Disposition: A | Discharge status: Home / Self Care | Payer: Self-pay | Attending: Emergency Medicine | Admitting: Emergency Medicine

## 2018-02-04 ENCOUNTER — Other Ambulatory Visit: Payer: Self-pay

## 2018-02-04 DIAGNOSIS — K0889 Other specified disorders of teeth and supporting structures: Secondary | ICD-10-CM | POA: Insufficient documentation

## 2018-02-04 DIAGNOSIS — I10 Essential (primary) hypertension: Secondary | ICD-10-CM | POA: Insufficient documentation

## 2018-02-04 DIAGNOSIS — Z79899 Other long term (current) drug therapy: Secondary | ICD-10-CM | POA: Insufficient documentation

## 2018-02-04 DIAGNOSIS — R030 Elevated blood-pressure reading, without diagnosis of hypertension: Secondary | ICD-10-CM | POA: Insufficient documentation

## 2018-02-04 MED ORDER — NAPROXEN 500 MG PO TABS: 500.0000 mg | ORAL_TABLET | Freq: Two times a day (BID) | ORAL | 0 refills | Status: DC

## 2018-02-04 MED ORDER — CLINDAMYCIN HCL 150 MG PO CAPS: 300.0000 mg | ORAL_CAPSULE | Freq: Three times a day (TID) | ORAL | 0 refills | Status: DC

## 2018-02-04 MED ORDER — IBUPROFEN 800 MG PO TABS: 800.0000 mg | ORAL_TABLET | Freq: Three times a day (TID) | ORAL | 0 refills | Status: DC | PRN

## 2018-02-04 MED ORDER — ACETAMINOPHEN 500 MG PO TABS
1000.0000 mg | ORAL_TABLET | Freq: Once | ORAL | Status: DC
  Filled 2018-02-04: qty 2

## 2018-02-04 MED ORDER — ACETAMINOPHEN 500 MG PO TABS: 500.0000 mg | ORAL_TABLET | Freq: Four times a day (QID) | ORAL | 0 refills | Status: DC | PRN

## 2018-02-04 MED ORDER — NAPROXEN 250 MG PO TABS
500.0000 mg | ORAL_TABLET | Freq: Once | ORAL | Status: AC
  Administered 2018-02-04: 500 mg via ORAL
  Filled 2018-02-04: qty 2

## 2018-02-04 NOTE — Discharge Instructions (Signed)
Medications: Naprosyn, Tylenol  Treatment: Take Naprosyn twice daily as prescribed.  Do not take more frequently.  You can alternate with Tylenol in between as prescribed.  Do not combine Naprosyn and ibuprofen or other NSAID medication.  Continue taking clindamycin as prescribed until completed.  I recommend you take a probiotic with this medication to help prevent stomach upset.  Follow-up: Please see dentist as soon as possible for further management.  Resources are provided below.  Please return to the emergency department if you develop any fever, significant increase in swelling or mass on your neck, or any other new or concerning symptom.

## 2018-02-04 NOTE — Discharge Instructions (Signed)
It is very important that you get evaluated by a dentist as soon as possible. Call as soon as possible to schedule an appointment. Ibuprofen as needed for pain. Take your full course of antibiotics. Read the instructions below.  Eat a soft or liquid diet and rinse your mouth out after meals with warm water. You should see a dentist or return here at once if you have increased swelling, increased pain or uncontrolled bleeding from the site of your injury.  Your blood pressure was elevated today. Please see a primary care doctor for blood pressure check.   SEEK MEDICAL CARE IF:  You have swelling around your tooth, in your face or neck.  You have bleeding which starts, continues, or gets worse.  You have a fever >101 If you are unable to open your mouth New or worsening symptoms develop, any additional concerns.

## 2018-02-04 NOTE — ED Provider Notes (Signed)
[facility] EMERGENCY DEPARTMENT Provider Note   CSN: [id number] Arrival date & time: 02/04/18  2024     History   Chief Complaint Chief Complaint  Patient presents with  . Dental Pain    HPI Eileen [name] is a 42 y.o. female his history of hypertension who presents with continued dental pain.  Patient was evaluated earlier today.  She was discharged home with clindamycin and ibuprofen.  She reports ibuprofen made her pain worse.  She has had some associated swelling to her left lower jaw.  She denies any fevers.  She plans to see a dentist next week.  She returns for something else for pain.  She requests naproxen, as this has helped her in the past.  HPI  Past Medical History:  Diagnosis Date  . Hypertension    has appt set up with PCP; not currently on meds  . Molar pregnancy   . Panic attacks    " a long time ago"  . Stillbirth   . Trichomonas     Patient Active Problem List   Diagnosis Date Noted  . Normal labor 09/16/2016  . Supervision of normal pregnancy 08/02/2016  . 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 Bleeding in early pregnancy 02/23/2016  . Trichomonas vaginalis (TV) infection 02/23/2016  . Chronic hypertension during pregnancy, antepartum 02/23/2016  . AMA (advanced maternal age) multigravida 35+ 02/23/2016  . 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 History of stillbirth 12/06/2012  . Chlamydia infection, current pregnancy 11/16/2012  . Insufficient prenatal care 11/13/2012  . Urinary retention with incomplete bladder emptying 06/16/2012    Past Surgical History:  Procedure Laterality Date  . CESAREAN SECTION    . DILATION AND CURETTAGE OF UTERUS       OB History    Gravida  11   Para  8   Term  7   Preterm  1   AB  2   Living  8     SAB  2   TAB  0   Ectopic  0   Multiple  0   Live Births  7            Home Medications    Prior to Admission medications   Medication Sig Start Date End Date Taking?  Authorizing Provider  acetaminophen (TYLENOL) 325 MG tablet Take 2 tablets (650 mg total) by mouth every 4 (four) hours as needed (for pain scale < 4). 09/18/16   Arabella Merles, CNM  clindamycin (CLEOCIN) 150 MG capsule Take 2 capsules (300 mg total) by mouth 3 (three) times daily. 02/04/18   Ward, Chase Picket, PA-C  HYDROcodone-acetaminophen (NORCO) 5-325 MG tablet Take 1 tablet by mouth every 6 (six) hours as needed for moderate pain. Patient not taking: Reported on 08/26/2017 12/05/16   Elvina Sidle, MD  ibuprofen (ADVIL,MOTRIN) 800 MG tablet Take 1 tablet (800 mg total) by mouth every 8 (eight) hours as needed (pain). 02/04/18   Ward, Chase Picket, PA-C  naproxen (NAPROSYN) 500 MG tablet Take 1 tablet (500 mg total) by mouth 2 (two) times daily. 08/26/17   Petrucelli, Pleas Koch, PA-C    Family History Family History  Problem Relation Age of Onset  . Hypertension Maternal Grandfather   . Depression Cousin   . Alcohol abuse Neg Hx   . Arthritis Neg Hx   . Asthma Neg Hx   . Birth defects Neg Hx   . Cancer Neg Hx   . COPD Neg Hx   .  Diabetes Neg Hx   . Drug abuse Neg Hx   . Early death Neg Hx   . Hearing loss Neg Hx   . Hyperlipidemia Neg Hx   . Heart disease Neg Hx   . Kidney disease Neg Hx   . Learning disabilities Neg Hx   . Mental illness Neg Hx   . Mental retardation Neg Hx   . Miscarriages / Stillbirths Neg Hx   . Stroke Neg Hx   . Vision loss Neg Hx     Social History Social History   Tobacco Use  . Smoking status: Never Smoker  . Smokeless tobacco: Never Used  Substance Use Topics  . Alcohol use: No  . Drug use: No     Allergies   Penicillins   Review of Systems Review of Systems  Constitutional: Negative for fever.  HENT: Positive for dental problem and facial swelling.      Physical Exam Updated Vital Signs BP (!) 144/100 (BP Location: Right Arm)   Pulse 93   Temp 98.6 F (37 C) (Oral)   Resp 16   Ht  (1.651 m)   Wt 81.6 kg (180  lb)   SpO2 100%   BMI 29.95 kg/m   Physical Exam  Constitutional: She appears well-developed and well-nourished. No distress.  HENT:  Head: Normocephalic and atraumatic.  Mouth/Throat: Oropharynx is clear and moist. No trismus in the jaw. Abnormal dentition. No dental abscesses. No oropharyngeal exudate, posterior oropharyngeal edema or posterior oropharyngeal erythema.    Eyes: Pupils are equal, round, and reactive to light. Conjunctivae are normal. Right eye exhibits no discharge. Left eye exhibits no discharge. No scleral icterus.  Neck: Normal range of motion. Neck supple.    Cardiovascular: Normal rate, regular rhythm, normal heart sounds and intact distal pulses. Exam reveals no gallop and no friction rub.  No murmur heard. Pulmonary/Chest: Effort normal and breath sounds normal. No stridor. No respiratory distress. She has no wheezes. She has no rales.  Musculoskeletal: She exhibits no edema.  Neurological: She is alert. Coordination normal.  Skin: Skin is warm and dry. No rash noted. She is not diaphoretic. No pallor.  Psychiatric: She has a normal mood and affect.  Nursing note and vitals reviewed.    ED Treatments / Results  Labs (all labs ordered are listed, but only abnormal results are displayed) Labs Reviewed - No data to display  EKG None  Radiology No results found.  Procedures Procedures (including critical care time)  Medications Ordered in ED Medications  naproxen (NAPROSYN) tablet 500 mg (has no administration in time range)  acetaminophen (TYLENOL) tablet 1,000 mg (has no administration in time range)     Initial Impression / Assessment and Plan / ED Course  I have reviewed the triage vital signs and the nursing notes.  Pertinent labs & imaging results that were available during my care of the patient were reviewed by me and considered in my medical decision making (see chart for details).     Patient with dentalgia.  No abscess requiring  immediate incision and drainage.  Exam not concerning for Ludwig's angina or pharyngeal abscess.  Patient offered dental block in the ED, however she declined.  Patient given Naprosyn and Tylenol in the ED and will discharge home with same.  Patient advised to continue clindamycin. Pt instructed to follow-up with dentist.  Discussed return precautions.  Patient understands and agrees with plan.  Patient vitals stable throughout ED course and discharged in satisfactory condition.  Final Clinical Impressions(s) / ED Diagnoses   Final diagnoses:  Pain, dental    ED Discharge Orders    None       Verdis Prime 02/04/18 2353    Azalia Bilis, MD 02/05/18 [phone]

## 2018-02-04 NOTE — ED Provider Notes (Signed)
[facility] EMERGENCY DEPARTMENT Provider Note   CSN: [id number] Arrival date & time: 02/04/18  0741     History   Chief Complaint Chief Complaint  Patient presents with  . Dental Pain    HPI Eileen [name] is a 42 y.o. female.  The history is provided by the patient and medical records. No language interpreter was used.  Dental Pain     KYLIE SIMMONDS is a 42 y.o. female who presents to the Emergency Department complaining of persistent, constant left-sided, lower dental pain beginning yesterday. Pt describes their pain as throbbing. Pt has been taking a family members pain pills at home with some relief of pain.They are not currently followed by dentistry, but family has informed her they would all chip in to help pay for an appointment given this has been an ongoing issue.  Pt denies facial swelling, fever, chills, difficulty breathing, difficulty swallowing.   Past Medical History:  Diagnosis Date  . Hypertension    has appt set up with PCP; not currently on meds  . Molar pregnancy   . Panic attacks    " a long time ago"  . Stillbirth   . Trichomonas     Patient Active Problem List   Diagnosis Date Noted  . Normal labor 09/16/2016  . Supervision of normal pregnancy 08/02/2016  . 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 Bleeding in early pregnancy 02/23/2016  . Trichomonas vaginalis (TV) infection 02/23/2016  . Chronic hypertension during pregnancy, antepartum 02/23/2016  . AMA (advanced maternal age) multigravida 35+ 02/23/2016  . 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 History of stillbirth 12/06/2012  . Chlamydia infection, current pregnancy 11/16/2012  . Insufficient prenatal care 11/13/2012  . Urinary retention with incomplete bladder emptying 06/16/2012    Past Surgical History:  Procedure Laterality Date  . CESAREAN SECTION    . DILATION AND CURETTAGE OF UTERUS       OB History    Gravida  11   Para  8   Term  7   Preterm  1   AB  2   Living  8     SAB  2   TAB  0   Ectopic  0   Multiple  0   Live Births  7            Home Medications    Prior to Admission medications   Medication Sig Start Date End Date Taking? Authorizing Provider  acetaminophen (TYLENOL) 325 MG tablet Take 2 tablets (650 mg total) by mouth every 4 (four) hours as needed (for pain scale < 4). 09/18/16   Arabella Merles, CNM  clindamycin (CLEOCIN) 150 MG capsule Take 2 capsules (300 mg total) by mouth 3 (three) times daily. 02/04/18   Ward, Chase Picket, PA-C  HYDROcodone-acetaminophen (NORCO) 5-325 MG tablet Take 1 tablet by mouth every 6 (six) hours as needed for moderate pain. Patient not taking: Reported on 08/26/2017 12/05/16   Elvina Sidle, MD  ibuprofen (ADVIL,MOTRIN) 800 MG tablet Take 1 tablet (800 mg total) by mouth every 8 (eight) hours as needed (pain). 02/04/18   Ward, Chase Picket, PA-C  naproxen (NAPROSYN) 500 MG tablet Take 1 tablet (500 mg total) by mouth 2 (two) times daily. 08/26/17   Petrucelli, Pleas Koch, PA-C    Family History Family History  Problem Relation Age of Onset  . Hypertension Maternal Grandfather   . Depression Cousin   . Alcohol abuse Neg Hx   . Arthritis Neg  Hx   . Asthma Neg Hx   . Birth defects Neg Hx   . Cancer Neg Hx   . COPD Neg Hx   . Diabetes Neg Hx   . Drug abuse Neg Hx   . Early death Neg Hx   . Hearing loss Neg Hx   . Hyperlipidemia Neg Hx   . Heart disease Neg Hx   . Kidney disease Neg Hx   . Learning disabilities Neg Hx   . Mental illness Neg Hx   . Mental retardation Neg Hx   . Miscarriages / Stillbirths Neg Hx   . Stroke Neg Hx   . Vision loss Neg Hx     Social History Social History   Tobacco Use  . Smoking status: Never Smoker  . Smokeless tobacco: Never Used  Substance Use Topics  . Alcohol use: No  . Drug use: No     Allergies   Penicillins   Review of Systems Review of Systems  Constitutional: Negative for chills and fever.  HENT: Positive for  dental problem. Negative for sore throat and trouble swallowing.   Respiratory: Negative for cough and shortness of breath.   Gastrointestinal: Negative for abdominal pain.  Neurological: Negative for dizziness, weakness and headaches.     Physical Exam Updated Vital Signs BP (!) 145/106 (BP Location: Right Arm)   Pulse (!) 105   Temp 98.5 F (36.9 C) (Oral)   Resp 16   SpO2 96%   Physical Exam  Constitutional: She appears well-developed and well-nourished. No distress.  HENT:  Head: Normocephalic and atraumatic.  Mouth/Throat: Oropharynx is clear and moist.    Neck: Neck supple.  Cardiovascular: Normal rate, regular rhythm and normal heart sounds.  No murmur heard. Pulmonary/Chest: Effort normal and breath sounds normal. No respiratory distress. She has no wheezes. She has no rales.  Musculoskeletal: Normal range of motion.  Neurological: She is alert.  Skin: Skin is warm and dry.  Nursing note and vitals reviewed.    ED Treatments / Results  Labs (all labs ordered are listed, but only abnormal results are displayed) Labs Reviewed - No data to display  EKG None  Radiology No results found.  Procedures Procedures (including critical care time)  Medications Ordered in ED Medications - No data to display   Initial Impression / Assessment and Plan / ED Course  I have reviewed the triage vital signs and the nursing notes.  Pertinent labs & imaging results that were available during my care of the patient were reviewed by me and considered in my medical decision making (see chart for details).    Eileen [name] is a 42 y.o. female who presents to ED for dental pain. No abscess requiring immediate incision and drainage. Patient is afebrile, non toxic appearing, and swallowing secretions well. Exam not concerning for Ludwig's angina or pharyngeal abscess. Will treat with Clinda (PCN allergy). I provided dental resource guide and stressed the importance of dental  follow up for ultimate management of dental pain. Patient also hypertensive today. Asymptomatic and benign cardiopulmonary exam. Encouraged to follow up with PCP for BP recheck and further evaluation / management of this. Patient voices understanding and is agreeable to plan.   Final Clinical Impressions(s) / ED Diagnoses   Final diagnoses:  Pain, dental  Elevated blood pressure reading    ED Discharge Orders        Ordered    clindamycin (CLEOCIN) 150 MG capsule  3 times daily  02/04/18 0757    ibuprofen (ADVIL,MOTRIN) 800 MG tablet  Every 8 hours PRN     02/04/18 0757       Ward, Chase Picket, PA-C 02/04/18 0804    Charlynne Pander, MD 02/04/18 [phone]

## 2018-02-04 NOTE — ED Triage Notes (Signed)
Patient complains of left lower mouth pain since yesterday. Alert and oriented and in no apparent distress at this time.

## 2018-02-04 NOTE — ED Triage Notes (Signed)
Patient was seen here this a.m., and the pain medication is not working. States that the swelling is worse also.

## 2018-02-05 NOTE — ED Notes (Signed)
Patient Alert and oriented to baseline. Stable and ambulatory to baseline. Patient verbalized understanding of the discharge instructions.  Patient belongings were taken by the patient.   

## 2018-05-27 ENCOUNTER — Emergency Department (HOSPITAL_COMMUNITY)
Admission: EM | Admit: 2018-05-27 | Discharge: 2018-05-27 | Disposition: A | Discharge status: Home / Self Care | Payer: Self-pay | Attending: Emergency Medicine | Admitting: Emergency Medicine

## 2018-05-27 ENCOUNTER — Encounter (HOSPITAL_COMMUNITY): Payer: Self-pay | Admitting: *Deleted

## 2018-05-27 ENCOUNTER — Other Ambulatory Visit: Payer: Self-pay

## 2018-05-27 DIAGNOSIS — Z79899 Other long term (current) drug therapy: Secondary | ICD-10-CM | POA: Insufficient documentation

## 2018-05-27 DIAGNOSIS — K0889 Other specified disorders of teeth and supporting structures: Secondary | ICD-10-CM | POA: Insufficient documentation

## 2018-05-27 DIAGNOSIS — I10 Essential (primary) hypertension: Secondary | ICD-10-CM | POA: Insufficient documentation

## 2018-05-27 MED ORDER — ACETAMINOPHEN 500 MG PO TABS: 500.0000 mg | ORAL_TABLET | Freq: Four times a day (QID) | ORAL | 0 refills | Status: DC | PRN

## 2018-05-27 MED ORDER — CLINDAMYCIN HCL 150 MG PO CAPS: 300.0000 mg | ORAL_CAPSULE | Freq: Three times a day (TID) | ORAL | 0 refills | Status: DC

## 2018-05-27 NOTE — ED Triage Notes (Signed)
Pt in c/o dental pain to the left lower jaw, denies fever

## 2018-05-27 NOTE — Discharge Instructions (Addendum)
Evaluated today for dental pain.  I have prescribed you antibiotic clindamycin and extra strength Tylenol.  Which  you state both have worked in the past for dental pain.  Highly recommend follow-up with a dentist for your persistent dental pain.  Pressure was also high during your visit.  Recommend you follow-up with your primary care physician.  High blood pressure can affect many organs including eyes, kidneys, heart.  Return to the ED with any of the following symptoms:  Contact a doctor if: Your pain is not helped with medicines. Your symptoms are worse. You have new symptoms. Get help right away if: You cannot open your mouth. You are having trouble breathing or swallowing. You have a fever. Your face, neck, or jaw is puffy (swollen).

## 2018-05-27 NOTE — ED Provider Notes (Signed)
[facility]   CSN: [id number] Arrival date & time: 05/27/18  [id number]     History   Chief Complaint Chief Complaint  Patient presents with  . Dental Pain    HPI Eileen [name] is a 42 y.o. female medical history significant for hypertension who presents for evaluation of tooth pain.  States she has felt pain to the left side back teeth beginning 4 days ago.  Pain is consistent.  She describes her pain as throbbing.  She has not taken anything for her pain.  States she has had this pain previously in the same area, however however has not had follow-up for it.  Denies radiation of pain.  Her pain is rated a 5/10. Denies fever, chills, facial swelling, difficulty breathing, difficulty swallowing, neck pain, neck stiffness  HPI  Past Medical History:  Diagnosis Date  . Hypertension    has appt set up with PCP; not currently on meds  . Molar pregnancy   . Panic attacks    " a long time ago"  . Stillbirth   . Trichomonas     Patient Active Problem List   Diagnosis Date Noted  . Normal labor 09/16/2016  . Supervision of normal pregnancy 08/02/2016  . 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 Bleeding in early pregnancy 02/23/2016  . Trichomonas vaginalis (TV) infection 02/23/2016  . Chronic hypertension during pregnancy, antepartum 02/23/2016  . AMA (advanced maternal age) multigravida 35+ 02/23/2016  . 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 History of stillbirth 12/06/2012  . Chlamydia infection, current pregnancy 11/16/2012  . Insufficient prenatal care 11/13/2012  . Urinary retention with incomplete bladder emptying 06/16/2012    Past Surgical History:  Procedure Laterality Date  . CESAREAN SECTION    . DILATION AND CURETTAGE OF UTERUS       OB History    Gravida  11   Para  8   Term  7   Preterm  1   AB  2   Living  8     SAB  2   TAB  0   Ectopic  0   Multiple  0   Live Births  7            Home  Medications    Prior to Admission medications   Medication Sig Start Date End Date Taking? Authorizing Provider  acetaminophen (TYLENOL) 500 MG tablet Take 1 tablet (500 mg total) by mouth every 6 (six) hours as needed. 05/27/18   Taeler Winning A, PA-C  clindamycin (CLEOCIN) 150 MG capsule Take 2 capsules (300 mg total) by mouth 3 (three) times daily. 05/27/18   Elanor Cale A, PA-C  HYDROcodone-acetaminophen (NORCO) 5-325 MG tablet Take 1 tablet by mouth every 6 (six) hours as needed for moderate pain. Patient not taking: Reported on 08/26/2017 12/05/16   Elvina SidleLauenstein, Kurt, MD  ibuprofen (ADVIL,MOTRIN) 800 MG tablet Take 1 tablet (800 mg total) by mouth every 8 (eight) hours as needed (pain). 02/04/18   Ward, Chase PicketJaime Pilcher, PA-C  naproxen (NAPROSYN) 500 MG tablet Take 1 tablet (500 mg total) by mouth 2 (two) times daily. 02/04/18   Emi HolesLaw, Alexandra M, PA-C    Family History Family History  Problem Relation Age of Onset  . Hypertension Maternal Grandfather   . Depression Cousin   . Alcohol abuse Neg Hx   . Arthritis Neg Hx   . Asthma Neg Hx   . Birth defects Neg Hx   . Cancer Neg  Hx   . COPD Neg Hx   . Diabetes Neg Hx   . Drug abuse Neg Hx   . Early death Neg Hx   . Hearing loss Neg Hx   . Hyperlipidemia Neg Hx   . Heart disease Neg Hx   . Kidney disease Neg Hx   . Learning disabilities Neg Hx   . Mental illness Neg Hx   . Mental retardation Neg Hx   . Miscarriages / Stillbirths Neg Hx   . Stroke Neg Hx   . Vision loss Neg Hx     Social History Social History   Tobacco Use  . Smoking status: Never Smoker  . Smokeless tobacco: Never Used  Substance Use Topics  . Alcohol use: No  . Drug use: No     Allergies   Penicillins   Review of Systems Review of Systems  Review of systems negative unless otherwise stated in the HPI Physical Exam Updated Vital Signs BP (!) 142/100 (BP Location: Right Arm)   Pulse 99   Temp 98.6 F (37 C) (Oral)   Resp 20   Ht 5\' 5"   (1.651 m)   Wt 80.7 kg   SpO2 100%   BMI 29.62 kg/m   Physical Exam  Constitutional: She appears well-developed and well-nourished. No distress.  HENT:  Head: Normocephalic and atraumatic.  Right Ear: External ear normal.  Left Ear: External ear normal.  Mouth/Throat: Uvula is midline, oropharynx is clear and moist and mucous membranes are normal. She does not have dentures. No oral lesions. Abnormal dentition. No dental abscesses or uvula swelling. No oropharyngeal exudate, posterior oropharyngeal edema, posterior oropharyngeal erythema or tonsillar abscesses. No tonsillar exudate.    Patient of palate.  No signs of dental abscess  Eyes: Pupils are equal, round, and reactive to light.  Neck: Normal range of motion. Neck supple.  Cardiovascular: Normal rate, regular rhythm, normal heart sounds and intact distal pulses. Exam reveals no gallop and no friction rub.  No murmur heard. Pulmonary/Chest: Effort normal and breath sounds normal. No stridor. No respiratory distress. She has no wheezes. She has no rales. She exhibits no tenderness.  Abdominal: She exhibits no distension.  Musculoskeletal: Normal range of motion.  Lymphadenopathy:    She has no cervical adenopathy.  Neurological: She is alert.  Skin: Skin is warm and dry. She is not diaphoretic.  Psychiatric: She has a normal mood and affect.  Nursing note and vitals reviewed.    ED Treatments / Results  Labs (all labs ordered are listed, but only abnormal results are displayed) Labs Reviewed - No data to display  EKG None  Radiology No results found.  Procedures Procedures (including critical care time)  Medications Ordered in ED Medications - No data to display   Initial Impression / Assessment and Plan / ED Course  I have reviewed the triage vital signs and the nursing notes as well past medical history.  Pertinent labs & imaging results that were available during my care of the patient were reviewed by me  and considered in my medical decision making (see chart for details).  She presents for evaluation of dental pain.  No abscess requiring immediate incision and drainage.  Febrile, nonseptic, non-ill-appearing.  Patient is able to tolerate secretions.  No neck pain or neck stiffness.  Non-concerning exam for blood weeks angina or pharyngeal abscess.  Has a penicillin allergy.  States clindamycin has worked in the past.  Will DC home with clindamycin and provide a guide  for dental follow-up.  She is hypertensive during her visit however normal cardiac exam she is asymptomatic.  Discussed follow-up for her dental pain and her high blood pressure.  Patient voiced understanding is agreeable to plan    Final Clinical Impressions(s) / ED Diagnoses   Final diagnoses:  Pain, dental  Hypertension, unspecified type    ED Discharge Orders         Ordered    clindamycin (CLEOCIN) 150 MG capsule  3 times daily     05/27/18 1035    acetaminophen (TYLENOL) 500 MG tablet  Every 6 hours PRN     05/27/18 1035           Apryl Brymer A, PA-C 05/27/18 1036    Tegeler, Canary Brim, MD 05/27/18 [phone]

## 2018-10-19 ENCOUNTER — Ambulatory Visit (HOSPITAL_BASED_OUTPATIENT_CLINIC_OR_DEPARTMENT_OTHER): Payer: Self-pay

## 2018-10-19 ENCOUNTER — Emergency Department (HOSPITAL_COMMUNITY)
Admission: EM | Admit: 2018-10-19 | Discharge: 2018-10-19 | Disposition: A | Discharge status: Home / Self Care | Payer: Self-pay | Attending: Emergency Medicine | Admitting: Emergency Medicine

## 2018-10-19 ENCOUNTER — Encounter (HOSPITAL_COMMUNITY): Payer: Self-pay | Admitting: Emergency Medicine

## 2018-10-19 DIAGNOSIS — M79605 Pain in left leg: Secondary | ICD-10-CM | POA: Insufficient documentation

## 2018-10-19 DIAGNOSIS — I10 Essential (primary) hypertension: Secondary | ICD-10-CM | POA: Insufficient documentation

## 2018-10-19 DIAGNOSIS — Z79899 Other long term (current) drug therapy: Secondary | ICD-10-CM | POA: Insufficient documentation

## 2018-10-19 DIAGNOSIS — R109 Unspecified abdominal pain: Secondary | ICD-10-CM | POA: Insufficient documentation

## 2018-10-19 DIAGNOSIS — M79609 Pain in unspecified limb: Secondary | ICD-10-CM

## 2018-10-19 MED ORDER — FAMOTIDINE 20 MG PO TABS: 20.0000 mg | ORAL_TABLET | Freq: Two times a day (BID) | ORAL | 0 refills | Status: DC

## 2018-10-19 MED ORDER — ACETAMINOPHEN 500 MG PO TABS
1000.0000 mg | ORAL_TABLET | Freq: Once | ORAL | Status: AC
  Administered 2018-10-19: 1000 mg via ORAL
  Filled 2018-10-19: qty 2

## 2018-10-19 MED ORDER — FAMOTIDINE 20 MG PO TABS
20.0000 mg | ORAL_TABLET | Freq: Once | ORAL | Status: AC
  Administered 2018-10-19: 20 mg via ORAL
  Filled 2018-10-19: qty 1

## 2018-10-19 NOTE — Discharge Instructions (Signed)
°  Please follow up with your primary care provider within 5-7 days for re-evaluation of your symptoms. If you do not have a primary care provider, information for a healthcare clinic has been provided for you to make arrangements for follow up care.  Please return to the ER sooner if you have any new or worsening symptoms, or if you have any of the following symptoms:  Abdominal pain that does not go away.  You have a fever.  You keep throwing up (vomiting).  The pain is felt only in portions of the abdomen. Pain in the right side could possibly be appendicitis. In an adult, pain in the left lower portion of the abdomen could be colitis or diverticulitis.  You pass bloody or black tarry stools.  There is bright red blood in the stool.  The constipation stays for more than 4 days.  There is belly (abdominal) or rectal pain.  You do not seem to be getting better.  You have any questions or concerns.

## 2018-10-19 NOTE — ED Notes (Signed)
Patient transported to Ultrasound 

## 2018-10-19 NOTE — Progress Notes (Signed)
VASCULAR LAB PRELIMINARY  PRELIMINARY  PRELIMINARY  PRELIMINARY  Left lower extremity venous duplex completed.    Preliminary report:  See CV proc  Called results to [facility], PA-C and Dr. Berniece Pap, [facility], RVT 10/19/2018, 2:29 PM

## 2018-10-19 NOTE — ED Triage Notes (Signed)
Pt arrives with reports of left leg pain for 2 weeks. Pt reports pain is worsened when standing all day. Denies any swelling or redness. Reports she has been diagnosed with HTN but does not take any medications

## 2018-10-19 NOTE — ED Provider Notes (Signed)
[facility]   CSN: [id number] Arrival date & time: 10/19/18  1214     History   Chief Complaint Chief Complaint  Patient presents with  . Leg Pain    HPI Eileen AmourMary A Whitson is a 43 y.o. female.  HPI   Pt is a 43 y/o female with a h/o HTN, panic attacks who presents to the ED today for evaluation of left leg pain that has been present for the last 2 weeks. Sxs have been intermittent. States pain is worse when she is standing and resolved when sitting. States she has not noted any redness or swelling. Denies injuries.   She gets intermittent chest pains that she attributes to anxiety about her son being in jail. No current pain. No shortness of breath.   Denies, hemoptysis, recent surgery/trauma, recent long travel, hormone use, personal hx of cancer, or hx of DVT/PE.   Past Medical History:  Diagnosis Date  . Hypertension    has appt set up with PCP; not currently on meds  . Molar pregnancy   . Panic attacks    " a long time ago"  . Stillbirth   . Trichomonas     Patient Active Problem List   Diagnosis Date Noted  . Normal labor 09/16/2016  . Supervision of normal pregnancy 08/02/2016  . 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 Bleeding in early pregnancy 02/23/2016  . Trichomonas vaginalis (TV) infection 02/23/2016  . Chronic hypertension during pregnancy, antepartum 02/23/2016  . AMA (advanced maternal age) multigravida 35+ 02/23/2016  . 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 History of stillbirth 12/06/2012  . Chlamydia infection, current pregnancy 11/16/2012  . Insufficient prenatal care 11/13/2012  . Urinary retention with incomplete bladder emptying 06/16/2012    Past Surgical History:  Procedure Laterality Date  . CESAREAN SECTION    . DILATION AND CURETTAGE OF UTERUS       OB History    Gravida  11   Para  8   Term  7   Preterm  1   AB  2   Living  8     SAB  2   TAB  0   Ectopic  0   Multiple    0   Live Births  7            Home Medications    Prior to Admission medications   Medication Sig Start Date End Date Taking? Authorizing Provider  acetaminophen (TYLENOL) 500 MG tablet Take 1 tablet (500 mg total) by mouth every 6 (six) hours as needed. 05/27/18   Henderly, Britni A, PA-C  clindamycin (CLEOCIN) 150 MG capsule Take 2 capsules (300 mg total) by mouth 3 (three) times daily. 05/27/18   Henderly, Britni A, PA-C  famotidine (PEPCID) 20 MG tablet Take 1 tablet (20 mg total) by mouth 2 (two) times daily for 7 days. 10/19/18 10/26/18  Xitlaly Ault S, PA-C  HYDROcodone-acetaminophen (NORCO) 5-325 MG tablet Take 1 tablet by mouth every 6 (six) hours as needed for moderate pain. Patient not taking: Reported on 08/26/2017 12/05/16   Elvina SidleLauenstein, Kurt, MD  ibuprofen (ADVIL,MOTRIN) 800 MG tablet Take 1 tablet (800 mg total) by mouth every 8 (eight) hours as needed (pain). 02/04/18   Ward, Chase PicketJaime Pilcher, PA-C  naproxen (NAPROSYN) 500 MG tablet Take 1 tablet (500 mg total) by mouth 2 (two) times daily. 02/04/18   Emi HolesLaw, Alexandra M, PA-C    Family History Family History  Problem Relation Age  of Onset  . Hypertension Maternal Grandfather   . Depression Cousin   . Alcohol abuse Neg Hx   . Arthritis Neg Hx   . Asthma Neg Hx   . Birth defects Neg Hx   . Cancer Neg Hx   . COPD Neg Hx   . Diabetes Neg Hx   . Drug abuse Neg Hx   . Early death Neg Hx   . Hearing loss Neg Hx   . Hyperlipidemia Neg Hx   . Heart disease Neg Hx   . Kidney disease Neg Hx   . Learning disabilities Neg Hx   . Mental illness Neg Hx   . Mental retardation Neg Hx   . Miscarriages / Stillbirths Neg Hx   . Stroke Neg Hx   . Vision loss Neg Hx     Social History Social History   Tobacco Use  . Smoking status: Never Smoker  . Smokeless tobacco: Never Used  Substance Use Topics  . Alcohol use: No  . Drug use: No     Allergies   Penicillins   Review of Systems Review of Systems  Constitutional:  Negative for fever.  HENT: Negative for congestion.   Respiratory: Negative for shortness of breath.   Cardiovascular: Negative for chest pain and leg swelling.  Gastrointestinal: Negative for abdominal pain.  Genitourinary: Negative for flank pain.  Musculoskeletal:       Left leg pain  Skin: Negative for wound.  Neurological: Negative for weakness and numbness.     Physical Exam Updated Vital Signs BP (!) 151/125   Pulse (!) 110   Temp 98.5 F (36.9 C) (Oral)   Resp 20   SpO2 100%   Physical Exam Vitals signs and nursing note reviewed.  Constitutional:      General: She is not in acute distress.    Appearance: She is well-developed.  HENT:     Head: Normocephalic and atraumatic.  Eyes:     Conjunctiva/sclera: Conjunctivae normal.  Neck:     Musculoskeletal: Neck supple.  Cardiovascular:     Rate and Rhythm: Normal rate and regular rhythm.     Heart sounds: Normal heart sounds. No murmur.  Pulmonary:     Effort: Pulmonary effort is normal. No respiratory distress.     Breath sounds: Normal breath sounds. No wheezing.  Abdominal:     General: Bowel sounds are normal.     Palpations: Abdomen is soft.     Tenderness: There is no abdominal tenderness.  Musculoskeletal: Normal range of motion.        General: Tenderness (left calf) present. No swelling.     Right lower leg: No edema.     Left lower leg: No edema.  Skin:    General: Skin is warm and dry.  Neurological:     Mental Status: She is alert.  Psychiatric:     Comments: Anxious, tearful      ED Treatments / Results  Labs (all labs ordered are listed, but only abnormal results are displayed) Labs Reviewed - No data to display  EKG None  Radiology Vas Koreas Lower Extremity Venous (dvt) (only Mc & Wl 7a-7p)  Result Date: 10/19/2018  Lower Venous Study Indications: Pain, mostly with standing, X 2 weeks  Comparison Study: No prior study on file Performing Technologist: Sherren Kernsandace Kanady RVS  Examination  Guidelines: A complete evaluation includes B-mode imaging, spectral Doppler, color Doppler, and power Doppler as needed of all accessible portions of each vessel. Bilateral testing is  considered an integral part of a complete examination. Limited examinations for reoccurring indications may be performed as noted.  Right Venous Findings: +---+---------------+---------+-----------+----------+-------+    CompressibilityPhasicitySpontaneityPropertiesSummary +---+---------------+---------+-----------+----------+-------+ CFVFull           Yes      Yes                          +---+---------------+---------+-----------+----------+-------+  Left Venous Findings: +---------+---------------+---------+-----------+----------+-------+          CompressibilityPhasicitySpontaneityPropertiesSummary +---------+---------------+---------+-----------+----------+-------+ CFV      Full                                                 +---------+---------------+---------+-----------+----------+-------+ SFJ      Full                                                 +---------+---------------+---------+-----------+----------+-------+ FV Prox  Full                                                 +---------+---------------+---------+-----------+----------+-------+ FV Mid   Full                                                 +---------+---------------+---------+-----------+----------+-------+ FV DistalFull                                                 +---------+---------------+---------+-----------+----------+-------+ PFV      Full                                                 +---------+---------------+---------+-----------+----------+-------+ POP      Full                                                 +---------+---------------+---------+-----------+----------+-------+ PTV      Full                                                  +---------+---------------+---------+-----------+----------+-------+ PERO     Full                                                 +---------+---------------+---------+-----------+----------+-------+ GSV      Full                                                 +---------+---------------+---------+-----------+----------+-------+  Summary: Right: No evidence of common femoral vein obstruction. Left: There is no evidence of deep vein thrombosis in the lower extremity.  *See table(s) above for measurements and observations.    Preliminary     Procedures Procedures (including critical care time)  Medications Ordered in ED Medications  acetaminophen (TYLENOL) tablet 1,000 mg (1,000 mg Oral Given 10/19/18 1343)  famotidine (PEPCID) tablet 20 mg (20 mg Oral Given 10/19/18 1359)     Initial Impression / Assessment and Plan / ED Course  I have reviewed the triage vital signs and the nursing notes.  Pertinent labs & imaging results that were available during my care of the patient were reviewed by me and considered in my medical decision making (see chart for details).     Final Clinical Impressions(s) / ED Diagnoses   Final diagnoses:  Left leg pain  Abdominal pain, unspecified abdominal location  Hypertension, unspecified type   Patient presenting to the emergency department today for evaluation of left lower extremity pain that has been present for the last 2 weeks.  She is had no injuries.  No lower extremity swelling or edema.  On exam she does have some calf tenderness to the left lower extremity.  No obvious edema or erythema.  Distal pulses intact. LE ultrasound ordered to r/o DVT. PT denies risk factors.   Reeval of pt. She is c/o luq abd pain. abd exam is without ttp, rebound or guarding.  Will give Pepcid.  On re-eval, she states she feels improved after taking Pepcid.  Patient requesting Rx for this.  Lower extremity ultrasound is negative for DVT.  Informed patient of  results of study.  Advised her that she should follow-up with PCP for further work-up as she may be experiencing claudication and she also needs to follow-up in regards to her blood pressure.  Have given information for PCP follow-up.  Have advised to return to the ER for new or worsening symptoms.  She voiced understanding of plan reasons return.  All questions answered.  ED Discharge Orders         Ordered    famotidine (PEPCID) 20 MG tablet  2 times daily     10/19/18 [address], [place] S, PA-C 10/19/18 1443    Linwood Dibbles, MD 10/19/18 1721

## 2018-11-27 ENCOUNTER — Emergency Department (HOSPITAL_COMMUNITY)
Admission: EM | Admit: 2018-11-27 | Discharge: 2018-11-27 | Disposition: A | Discharge status: Home / Self Care | Payer: Self-pay | Attending: Emergency Medicine | Admitting: Emergency Medicine

## 2018-11-27 ENCOUNTER — Encounter (HOSPITAL_COMMUNITY): Payer: Self-pay | Admitting: Emergency Medicine

## 2018-11-27 ENCOUNTER — Emergency Department (HOSPITAL_COMMUNITY): Payer: Self-pay

## 2018-11-27 ENCOUNTER — Other Ambulatory Visit: Payer: Self-pay

## 2018-11-27 DIAGNOSIS — M79662 Pain in left lower leg: Secondary | ICD-10-CM | POA: Insufficient documentation

## 2018-11-27 DIAGNOSIS — M79605 Pain in left leg: Secondary | ICD-10-CM

## 2018-11-27 DIAGNOSIS — I10 Essential (primary) hypertension: Secondary | ICD-10-CM | POA: Insufficient documentation
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
## 2018-11-27 NOTE — ED Triage Notes (Addendum)
Has a bruise on left calf has no idea how it got there wants to be sure she doent have a clot denies injury  Has hurt since last night

## 2018-11-27 NOTE — ED Notes (Signed)
Per other nurses pt left saying she could not stay and told another nurse her ride was here

## 2019-01-25 ENCOUNTER — Encounter: Payer: Self-pay | Admitting: *Deleted

## 2019-08-31 ENCOUNTER — Emergency Department (HOSPITAL_COMMUNITY)
Admission: EM | Admit: 2019-08-31 | Discharge: 2019-08-31 | Disposition: A | Discharge status: Home / Self Care | Payer: Self-pay | Attending: Emergency Medicine | Admitting: Emergency Medicine

## 2019-08-31 ENCOUNTER — Other Ambulatory Visit: Payer: Self-pay

## 2019-08-31 ENCOUNTER — Emergency Department (HOSPITAL_COMMUNITY): Payer: Self-pay

## 2019-08-31 DIAGNOSIS — R05 Cough: Secondary | ICD-10-CM | POA: Insufficient documentation

## 2019-08-31 DIAGNOSIS — Z20828 Contact with and (suspected) exposure to other viral communicable diseases: Secondary | ICD-10-CM | POA: Insufficient documentation

## 2019-08-31 DIAGNOSIS — R0789 Other chest pain: Secondary | ICD-10-CM

## 2019-08-31 DIAGNOSIS — F419 Anxiety disorder, unspecified: Secondary | ICD-10-CM

## 2019-08-31 DIAGNOSIS — I10 Essential (primary) hypertension: Secondary | ICD-10-CM | POA: Insufficient documentation

## 2019-08-31 LAB — BASIC METABOLIC PANEL
Anion gap: 9 (ref 5–15)
BUN: 8 mg/dL (ref 6–20)
CO2: 26 mmol/L (ref 22–32)
Calcium: 9.4 mg/dL (ref 8.9–10.3)
Chloride: 107 mmol/L (ref 98–111)
Creatinine, Ser: 0.92 mg/dL (ref 0.44–1.00)
GFR calc Af Amer: >60 mL/min (ref >60)
GFR calc non Af Amer: >60 mL/min (ref >60)
Glucose, Bld: 104 mg/dL — ABNORMAL HIGH (ref 70–99)
Potassium: 4 mmol/L (ref 3.5–5.1)
Sodium: 142 mmol/L (ref 135–145)

## 2019-08-31 LAB — CBC WITH DIFFERENTIAL/PLATELET
Abs Immature Granulocytes: 0.02 10*3/uL (ref 0.00–0.07)
Basophils Absolute: 0 10*3/uL (ref 0.0–0.1)
Basophils Relative: 0 %
Eosinophils Absolute: 0.1 10*3/uL (ref 0.0–0.5)
Eosinophils Relative: 2 %
HCT: 40 % (ref 36.0–46.0)
Hemoglobin: 13.2 g/dL (ref 12.0–15.0)
Immature Granulocytes: 0 %
Lymphocytes Relative: 27 %
Lymphs Abs: 2.3 10*3/uL (ref 0.7–4.0)
MCH: 27.2 pg (ref 26.0–34.0)
MCHC: 33 g/dL (ref 30.0–36.0)
MCV: 82.5 fL (ref 80.0–100.0)
Monocytes Absolute: 0.3 10*3/uL (ref 0.1–1.0)
Monocytes Relative: 4 %
Neutro Abs: 5.7 10*3/uL (ref 1.7–7.7)
Neutrophils Relative %: 67 %
Platelets: 254 10*3/uL (ref 150–400)
RBC: 4.85 MIL/uL (ref 3.87–5.11)
RDW: 13.9 % (ref 11.5–15.5)
WBC: 8.5 10*3/uL (ref 4.0–10.5)
nRBC: 0 % (ref 0.0–0.2)

## 2019-08-31 LAB — I-STAT BETA HCG BLOOD, ED (MC, WL, AP ONLY): I-stat hCG, quantitative: <5 m[IU]/mL (ref <5)

## 2019-08-31 LAB — SARS CORONAVIRUS 2 (TAT 6-24 HRS): SARS Coronavirus 2: NEGATIVE

## 2019-08-31 LAB — TROPONIN I (HIGH SENSITIVITY)
Troponin I (High Sensitivity): 3 ng/L (ref <18)
Troponin I (High Sensitivity): 3 ng/L (ref <18)

## 2019-08-31 MED ORDER — LORAZEPAM 1 MG PO TABS: 1.0000 mg | ORAL_TABLET | Freq: Once | ORAL | Status: DC

## 2019-08-31 NOTE — ED Notes (Signed)
Patient verbalizes understanding of discharge instructions. Opportunity for questioning and answers were provided. Armband removed by staff, pt discharged from ED to home via POV (son)

## 2019-08-31 NOTE — ED Notes (Signed)
Pt called out for restroom, upon arriving to the room noted pt appeared distressed. Pt states she is having an anxiety attack, unable to give details about whether something triggered this or if staff can help. Assisted to restroom and then back to bed and spoke to EDP about pt anxiety. Ativan ordered by EDP should the pt want it, pt declines at this time, states " I just need to talk to someone about all this. I don't think I need any medicine." Assisted pt to reposition in bed and ensured call light in reach
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
## 2019-08-31 NOTE — TOC Transition Note (Signed)
Transition of Care [facility]) - CM/SW Discharge Note   Patient Details  Name: Eileen [name] MRN: [id number] Date of Birth: 11-08-1975  Transition of Care [facility]) CM/SW Contact:  Vergie Living, LCSW Phone Number: 08/31/2019, 1:53 PM   Clinical Narrative:  EDCSW made an appointment for Pt at [facility]  For January 12 at 2:30. Gave resources for [employer]. Made referral to hospice ([facility]) for grief counseling.      Final next level of care: Home/Self Care Barriers to Discharge: Barriers Resolved   Patient Goals and CMS Choice Patient states their goals for this hospitalization and ongoing recovery are:: Connect to PCP   Choice offered to / list presented to : Patient  Discharge Placement                       Discharge Plan and Services In-house Referral: PCP / [facility], Hospice / Palliative Care Discharge Planning Services: [facility], Follow-up appt scheduled                                 Social Determinants of Health (SDOH) Interventions     Readmission Risk Interventions No flowsheet data found.

## 2019-08-31 NOTE — TOC Progression Note (Signed)
Transition of Care [facility]) - Progression Note    Patient Details  Name: Eileen [name] MRN: [id number] Date of Birth: Jun 29, 1976  Transition of Care [facility]) CM/SW Contact  Vergie Living, LCSW Phone Number: 08/31/2019, 1:31 PM  Clinical Narrative:   EDCSW met with Pt at bedside. Resources for [employer] were presented as well as discussions about Medicaid and possible school insurance.          Expected Discharge Plan and Services                                                 Social Determinants of Health (SDOH) Interventions    Readmission Risk Interventions No flowsheet data found.

## 2019-08-31 NOTE — ED Provider Notes (Signed)
[facility] EMERGENCY DEPARTMENT Provider Note   CSN: [id number] Arrival date & time: 08/31/19  3545     History Chief Complaint  Patient presents with  . Chest Pain    Eileen [name] is a 43 y.o. female.  The history is provided by the patient.  Chest Pain Chest pain location: upper left and right chest. Pain quality: aching   Pain radiates to:  Does not radiate Pain severity:  Moderate Onset quality:  Sudden Duration:  18 hours Timing:  Intermittent Progression:  Resolved Chronicity:  New Context comment:  Started spontaneously last night when she was laying down.  seems to be better with lying down but notices it the most when she goes out in the cold Relieved by: lying down. Exacerbated by: cold air. Ineffective treatments:  None tried Associated symptoms: cough   Associated symptoms: no abdominal pain, no anorexia, no back pain, no fever, no lower extremity edema, no nausea, no near-syncope, no palpitations, no shortness of breath, no vomiting and no weakness   Risk factors: hypertension and smoking   Risk factors comment:  Does not take meds.  also states she has been dealing with depression but has not seen anyone for this      Past Medical History:  Diagnosis Date  . Hypertension    has appt set up with PCP; not currently on meds  . Molar pregnancy   . Panic attacks    " a long time ago"  . Stillbirth   . Trichomonas     Patient Active Problem List   Diagnosis Date Noted  . Normal labor 09/16/2016  . Supervision of normal pregnancy 08/02/2016  . 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 Bleeding in early pregnancy 02/23/2016  . Trichomonas vaginalis (TV) infection 02/23/2016  . Chronic hypertension during pregnancy, antepartum 02/23/2016  . AMA (advanced maternal age) multigravida 35+ 02/23/2016  . 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 History of stillbirth 12/06/2012  . Chlamydia infection, current pregnancy 11/16/2012  . Insufficient  prenatal care 11/13/2012  . Urinary retention with incomplete bladder emptying 06/16/2012    Past Surgical History:  Procedure Laterality Date  . CESAREAN SECTION    . DILATION AND CURETTAGE OF UTERUS       OB History    Gravida  11   Para  8   Term  7   Preterm  1   AB  2   Living  8     SAB  2   TAB  0   Ectopic  0   Multiple  0   Live Births  7           Family History  Problem Relation Age of Onset  . Hypertension Maternal Grandfather   . Depression Cousin   . Alcohol abuse Neg Hx   . Arthritis Neg Hx   . Asthma Neg Hx   . Birth defects Neg Hx   . Cancer Neg Hx   . COPD Neg Hx   . Diabetes Neg Hx   . Drug abuse Neg Hx   . Early death Neg Hx   . Hearing loss Neg Hx   . Hyperlipidemia Neg Hx   . Heart disease Neg Hx   . Kidney disease Neg Hx   . Learning disabilities Neg Hx   . Mental illness Neg Hx   . Mental retardation Neg Hx   . Miscarriages / Stillbirths Neg Hx   . Stroke Neg Hx   . Vision  loss Neg Hx     Social History   Tobacco Use  . Smoking status: Never Smoker  . Smokeless tobacco: Never Used  Substance Use Topics  . Alcohol use: No  . Drug use: No    Home Medications Prior to Admission medications   Medication Sig Start Date End Date Taking? Authorizing Provider  acetaminophen (TYLENOL) 500 MG tablet Take 1 tablet (500 mg total) by mouth every 6 (six) hours as needed. 05/27/18   Henderly, Britni A, PA-C  clindamycin (CLEOCIN) 150 MG capsule Take 2 capsules (300 mg total) by mouth 3 (three) times daily. 05/27/18   Henderly, Britni A, PA-C  famotidine (PEPCID) 20 MG tablet Take 1 tablet (20 mg total) by mouth 2 (two) times daily for 7 days. 10/19/18 10/26/18  Couture, Cortni S, PA-C  HYDROcodone-acetaminophen (NORCO) 5-325 MG tablet Take 1 tablet by mouth every 6 (six) hours as needed for moderate pain. Patient not taking: Reported on 08/26/2017 12/05/16   Elvina SidleLauenstein, Kurt, MD  ibuprofen (ADVIL,MOTRIN) 800 MG tablet Take 1 tablet  (800 mg total) by mouth every 8 (eight) hours as needed (pain). 02/04/18   Ward, Chase PicketJaime Pilcher, PA-C  naproxen (NAPROSYN) 500 MG tablet Take 1 tablet (500 mg total) by mouth 2 (two) times daily. 02/04/18   Emi HolesLaw, Alexandra M, PA-C    Allergies    Penicillins  Review of Systems   Review of Systems  Constitutional: Negative for fever.  Respiratory: Positive for cough. Negative for shortness of breath.   Cardiovascular: Positive for chest pain. Negative for palpitations and near-syncope.  Gastrointestinal: Negative for abdominal pain, anorexia, nausea and vomiting.  Musculoskeletal: Negative for back pain.  Neurological: Negative for weakness.  Psychiatric/Behavioral: Positive for dysphoric mood. Negative for suicidal ideas.  All other systems reviewed and are negative.   Physical Exam Updated Vital Signs BP (!) 155/89   Pulse 91   Temp 98.4 F (36.9 C) (Oral)   Resp 18   Ht 5\' 5"  (1.651 m)   Wt 81.6 kg   SpO2 100%   BMI 29.95 kg/m   Physical Exam Vitals and nursing note reviewed.  Constitutional:      General: She is not in acute distress.    Appearance: She is well-developed and normal weight.     Comments: Tearful on exam  HENT:     Head: Normocephalic and atraumatic.  Eyes:     Pupils: Pupils are equal, round, and reactive to light.  Cardiovascular:     Rate and Rhythm: Normal rate and regular rhythm.     Heart sounds: Normal heart sounds. No murmur. No friction rub.  Pulmonary:     Effort: Pulmonary effort is normal.     Breath sounds: Normal breath sounds. No wheezing or rales.  Abdominal:     General: Bowel sounds are normal. There is no distension.     Palpations: Abdomen is soft.     Tenderness: There is no abdominal tenderness. There is no guarding or rebound.  Musculoskeletal:        General: No tenderness. Normal range of motion.     Right lower leg: No edema.     Left lower leg: No edema.     Comments: No edema  Skin:    General: Skin is warm and dry.       Capillary Refill: Capillary refill takes less than 2 seconds.     Findings: No rash.  Neurological:     General: No focal deficit present.  Mental Status: She is alert and oriented to person, place, and time. Mental status is at baseline.     Cranial Nerves: No cranial nerve deficit.  Psychiatric:        Mood and Affect: Mood is depressed.        Cognition and Memory: Cognition normal.        Judgment: Judgment normal.     Comments: Tearful on exam and states she under a lot of stress and feeling depressed.  No SI thoughts or plan     ED Results / Procedures / Treatments   Labs (all labs ordered are listed, but only abnormal results are displayed) Labs Reviewed  BASIC METABOLIC PANEL - Abnormal; Notable for the following components:      Result Value   Glucose, Bld 104 (*)    All other components within normal limits  SARS CORONAVIRUS 2 (TAT 6-24 HRS)  CBC WITH DIFFERENTIAL/PLATELET  I-STAT BETA HCG BLOOD, ED ([facility], [facility], [facility] ONLY)  TROPONIN I (HIGH SENSITIVITY)  TROPONIN I (HIGH SENSITIVITY)    EKG EKG Interpretation  Date/Time:  Friday August 31 2019 09:35:48 EST Ventricular Rate:  91 PR Interval:    QRS Duration: 89 QT Interval:  375 QTC Calculation: 462 R Axis:   35 Text Interpretation: Sinus rhythm Abnormal R-wave progression, early transition No significant change since last tracing Confirmed by Blanchie Dessert ([id number]) on 08/31/2019 9:46:36 AM   Radiology DG Chest Port 1 View  Result Date: 08/31/2019 CLINICAL DATA:  Chest pain. EXAM: PORTABLE CHEST 1 VIEW COMPARISON:  November 29, 2008. FINDINGS: The heart size and mediastinal contours are within normal limits. Both lungs are clear. No pneumothorax or pleural effusion is noted. The visualized skeletal structures are unremarkable. IMPRESSION: No active disease. Electronically Signed   By: Marijo Conception M.D.   On: 08/31/2019 10:20    Procedures Procedures (including critical care time)  Medications  Ordered in ED Medications - No data to display  ED Course  I have reviewed the triage vital signs and the nursing notes.  Pertinent labs & imaging results that were available during my care of the patient were reviewed by me and considered in my medical decision making (see chart for details).    MDM Rules/Calculators/A&P                      43 year old female presenting today with atypical chest pain that started last night and seems to be worse with cold weather.  She does admit to being under a lot of stress recently and feeling depressed.  However she has not sought care for this because she does not have insurance at this time.  She is also had a cough for the last 2 to 3 weeks that is intermittently productive but denies any fever, shortness of breath, abdominal pain, nausea, vomiting or diarrhea.  She has no personal or family history of PE and she denies any heart disease.  She does have a history of hypertension but has not been on medication for hypertension for over 5 years because she cannot afford to see a doctor. On exam patient is well-appearing with mild hypertension but is tearful.  Chest pain is not reproducible and she currently states there is minimal to no pain at this time.  No prior history of asthma but patient does smoke cigarettes.  Low suspicion for dissection, ACS, PE.  Possible pneumonia versus stress related response.  Low suspicion for GI etiology.  EKG within  normal limits.  Chest x-ray and labs are pending.  1:30 PM Labs and imaging are neg.  Pt wishes to be tested for COVID due to the 2-3 weeks of coughing.  Chest pain is most likely msk or pleurisy.  Also pt has a lot of stress right now which is most likely making things worse.  She denies SI but would like to talk with someone.  Pt given outpt resources and case management to eval to get pt a regular PCP.  Pt repeat BP here improved after anxiolysis.  Final Clinical Impression(s) / ED Diagnoses Final  diagnoses:  Atypical chest pain  Anxiety    Rx / DC Orders ED Discharge Orders    None       Gwyneth Sprout, MD 08/31/19 1357

## 2019-08-31 NOTE — Discharge Instructions (Addendum)
All your lab work and x-rays were normal today.  Covid test will be back by tomorrow.  Your pain may be due to irritation from the coughing or weather.  Also stress if probably making things worse.  Follow up outpatient for counseling and for a regular doctor to monitor you blood pressure.

## 2019-09-04 ENCOUNTER — Telehealth: Payer: Self-pay | Admitting: *Deleted

## 2019-09-04 NOTE — Telephone Encounter (Signed)
EDCM spoke with pt regarding home needs.

## 2019-09-25 ENCOUNTER — Telehealth (INDEPENDENT_AMBULATORY_CARE_PROVIDER_SITE_OTHER): Payer: Self-pay | Admitting: Primary Care

## 2020-04-23 ENCOUNTER — Other Ambulatory Visit: Payer: Self-pay

## 2020-04-23 ENCOUNTER — Emergency Department (HOSPITAL_COMMUNITY): Admission: EM | Admit: 2020-04-23 | Discharge: 2020-04-23 | Discharge status: Against Medical Advice | Payer: Self-pay

## 2020-04-23 NOTE — ED Notes (Signed)
Pt called multiple times with no answer in lobby 

## 2020-04-23 NOTE — ED Notes (Signed)
Called for Pt No answer 

## 2020-07-08 ENCOUNTER — Encounter (HOSPITAL_COMMUNITY): Payer: Self-pay | Admitting: Emergency Medicine

## 2020-07-08 ENCOUNTER — Emergency Department (HOSPITAL_COMMUNITY)
Admission: EM | Admit: 2020-07-08 | Discharge: 2020-07-08 | Disposition: A | Discharge status: Home / Self Care | Payer: Self-pay | Attending: Emergency Medicine | Admitting: Emergency Medicine

## 2020-07-08 DIAGNOSIS — I10 Essential (primary) hypertension: Secondary | ICD-10-CM | POA: Insufficient documentation

## 2020-07-08 DIAGNOSIS — K047 Periapical abscess without sinus: Secondary | ICD-10-CM | POA: Insufficient documentation

## 2020-07-08 MED ORDER — LISINOPRIL 10 MG PO TABS: 10.0000 mg | ORAL_TABLET | Freq: Every day | ORAL | 1 refills | Status: DC

## 2020-07-08 MED ORDER — CLINDAMYCIN HCL 150 MG PO CAPS: 300.0000 mg | ORAL_CAPSULE | Freq: Three times a day (TID) | ORAL | 0 refills | Status: DC

## 2020-07-08 MED ORDER — IBUPROFEN 600 MG PO TABS: 600.0000 mg | ORAL_TABLET | Freq: Four times a day (QID) | ORAL | 0 refills | Status: DC | PRN

## 2020-07-08 NOTE — ED Triage Notes (Signed)
Pt endorses 2 sores in her mouth for 3 weeks. Hx of HTN but not on meds for 4-5 years due to no PCP.

## 2020-07-08 NOTE — Discharge Instructions (Signed)
Please see the attached phone numbers on this paperwork for local family doctors.  [facility], you should have your blood pressure rechecked there within 2 weeks  Take clindamycin 3 times a day for the infection  Take lisinopril 10 mg daily for your hypertension.  Please be aware that this may cause some swelling of the tongue or lips, if that occurs stop the medicine immediately and come to the ER

## 2020-07-08 NOTE — ED Provider Notes (Signed)
[facility] EMERGENCY DEPARTMENT Provider Note   CSN: [id number] Arrival date & time: 07/08/20  0813     History Chief Complaint  Patient presents with  . Mouth Lesions    Eileen [name] is a 44 y.o. female.  HPI   This patient is a very nice 44 year old female, she presents to the hospital today with a complaint of left-sided dental pain and bleeding lumps that are on the left lower jaw abutting her rear molars.  She has had these for a week or 2 but has noticed that they have grown in size slightly and had had some bleeding.  She has not seen a dentist, she does not have any medical insurance, she becomes very tearful during this interview.  She also endorses not having a family doctor, not taking blood pressure medication despite the need for it secondary to the lack of a family doctor.  She denies fevers or chills, states that she is able to chew but not on the left side, she has no difficulty opening and closing her mouth.  She has not been on recent antibiotics, she does have a penicillin allergy  Past Medical History:  Diagnosis Date  . Hypertension    has appt set up with PCP; not currently on meds  . Molar pregnancy   . Panic attacks    " a long time ago"  . Stillbirth   . Trichomonas     Patient Active Problem List   Diagnosis Date Noted  . Normal labor 09/16/2016  . Supervision of normal pregnancy 08/02/2016  . 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 Bleeding in early pregnancy 02/23/2016  . Trichomonas vaginalis (TV) infection 02/23/2016  . Chronic hypertension during pregnancy, antepartum 02/23/2016  . AMA (advanced maternal age) multigravida 35+ 02/23/2016  . 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 History of stillbirth 12/06/2012  . Chlamydia infection, current pregnancy 11/16/2012  . Insufficient prenatal care 11/13/2012  . Urinary retention with incomplete bladder emptying 06/16/2012    Past Surgical History:  Procedure Laterality Date  .  CESAREAN SECTION    . DILATION AND CURETTAGE OF UTERUS       OB History    Gravida  11   Para  8   Term  7   Preterm  1   AB  2   Living  8     SAB  2   TAB  0   Ectopic  0   Multiple  0   Live Births  7           Family History  Problem Relation Age of Onset  . Hypertension Maternal Grandfather   . Depression Cousin   . Alcohol abuse Neg Hx   . Arthritis Neg Hx   . Asthma Neg Hx   . Birth defects Neg Hx   . Cancer Neg Hx   . COPD Neg Hx   . Diabetes Neg Hx   . Drug abuse Neg Hx   . Early death Neg Hx   . Hearing loss Neg Hx   . Hyperlipidemia Neg Hx   . Heart disease Neg Hx   . Kidney disease Neg Hx   . Learning disabilities Neg Hx   . Mental illness Neg Hx   . Mental retardation Neg Hx   . Miscarriages / Stillbirths Neg Hx   . Stroke Neg Hx   . Vision loss Neg Hx     Social History   Tobacco Use  . Smoking  status: Never Smoker  . Smokeless tobacco: Never Used  Substance Use Topics  . Alcohol use: No  . Drug use: No    Home Medications Prior to Admission medications   Medication Sig Start Date End Date Taking? Authorizing Provider  clindamycin (CLEOCIN) 150 MG capsule Take 2 capsules (300 mg total) by mouth 3 (three) times daily. May dispense as 150mg  capsules 07/08/20   07/10/20, MD  ibuprofen (ADVIL) 600 MG tablet Take 1 tablet (600 mg total) by mouth every 6 (six) hours as needed. 07/08/20   07/10/20, MD  lisinopril (ZESTRIL) 10 MG tablet Take 1 tablet (10 mg total) by mouth daily. 07/08/20   07/10/20, MD  famotidine (PEPCID) 20 MG tablet Take 1 tablet (20 mg total) by mouth 2 (two) times daily for 7 days. 10/19/18 07/08/20  Couture, Cortni S, PA-C    Allergies    Penicillins  Review of Systems   Review of Systems  Constitutional: Positive for fever.  HENT: Positive for dental problem. Negative for facial swelling.   Respiratory: Negative for cough and shortness of breath.   Cardiovascular: Negative for chest pain.    Gastrointestinal: Negative for nausea and vomiting.  Neurological: Negative for headaches.    Physical Exam Updated Vital Signs BP (!) 169/121 (BP Location: Right Wrist)   Pulse 100   Temp 98.7 F (37.1 C) (Oral)   Resp 18   Ht 1.651 m (5\' 5" )   Wt 82.6 kg   SpO2 100%   BMI 30.29 kg/m   Physical Exam Vitals and nursing note reviewed.  Constitutional:      Appearance: She is well-developed. She is not diaphoretic.  HENT:     Head: Normocephalic and atraumatic.     Mouth/Throat:     Comments: Dentition is generally poor however the left lower first and second molars are severely degraded with what appears to be small abscesses in the periapical regions, there is no swelling over the jaw, the gingiva appears normal, there is no trismus or torticollis, there is normal phonation, there is no tenderness of the tongue Eyes:     General:        Right eye: No discharge.        Left eye: No discharge.     Conjunctiva/sclera: Conjunctivae normal.  Cardiovascular:     Rate and Rhythm: Normal rate.  Pulmonary:     Effort: Pulmonary effort is normal. No respiratory distress.  Lymphadenopathy:     Cervical: No cervical adenopathy.  Skin:    General: Skin is warm and dry.     Findings: No erythema or rash.  Neurological:     Mental Status: She is alert.     Coordination: Coordination normal.  Psychiatric:     Comments: Tearful     ED Results / Procedures / Treatments   Labs (all labs ordered are listed, but only abnormal results are displayed) Labs Reviewed - No data to display  EKG None  Radiology No results found.  Procedures Procedures (including critical care time)  Medications Ordered in ED Medications - No data to display  ED Course  I have reviewed the triage vital signs and the nursing notes.  Pertinent labs & imaging results that were available during my care of the patient were reviewed by me and considered in my medical decision making (see chart for  details).    MDM Rules/Calculators/A&P  This patient appears to have ongoing untreated hypertension, I will initiate some medications for her today but she is asymptomatic and not in need of hospitalization.  She also has what appears to be some dental abscesses abutting 2 teeth that are severely degraded.  These do not need any surgical intervention however I will refer her to local dentistry as we do have somebody on call today.  The patient has been informed that these medications will be sent to her pharmacy  Final Clinical Impression(s) / ED Diagnoses Final diagnoses:  Dental abscess  Primary hypertension    Rx / DC Orders ED Discharge Orders         Ordered    ibuprofen (ADVIL) 600 MG tablet  Every 6 hours PRN        07/08/20 0905    clindamycin (CLEOCIN) 150 MG capsule  3 times daily        07/08/20 0905    lisinopril (ZESTRIL) 10 MG tablet  Daily        07/08/20 0905           Eber Hong, MD 07/08/20 [phone]

## 2020-09-11 ENCOUNTER — Emergency Department (HOSPITAL_COMMUNITY)
Admission: EM | Admit: 2020-09-11 | Discharge: 2020-09-11 | Disposition: A | Discharge status: Home / Self Care | Payer: Self-pay | Attending: Emergency Medicine | Admitting: Emergency Medicine

## 2020-09-11 ENCOUNTER — Encounter (HOSPITAL_COMMUNITY): Payer: Self-pay | Admitting: Emergency Medicine

## 2020-09-11 DIAGNOSIS — K0889 Other specified disorders of teeth and supporting structures: Secondary | ICD-10-CM | POA: Insufficient documentation

## 2020-09-11 DIAGNOSIS — Z5321 Procedure and treatment not carried out due to patient leaving prior to being seen by health care provider: Secondary | ICD-10-CM | POA: Insufficient documentation

## 2020-09-11 NOTE — ED Triage Notes (Signed)
Pt reports LL dental pain since yesterday, does not have a dentist.

## 2021-04-19 ENCOUNTER — Encounter (HOSPITAL_COMMUNITY): Payer: Self-pay | Admitting: Emergency Medicine

## 2021-04-19 ENCOUNTER — Other Ambulatory Visit: Payer: Self-pay

## 2021-04-19 ENCOUNTER — Emergency Department (HOSPITAL_COMMUNITY)
Admission: EM | Admit: 2021-04-19 | Discharge: 2021-04-19 | Disposition: A | Discharge status: Home / Self Care | Payer: Self-pay | Attending: Emergency Medicine | Admitting: Emergency Medicine

## 2021-04-19 DIAGNOSIS — I10 Essential (primary) hypertension: Secondary | ICD-10-CM | POA: Insufficient documentation

## 2021-04-19 DIAGNOSIS — R55 Syncope and collapse: Secondary | ICD-10-CM | POA: Insufficient documentation

## 2021-04-19 DIAGNOSIS — Z5321 Procedure and treatment not carried out due to patient leaving prior to being seen by health care provider: Secondary | ICD-10-CM | POA: Insufficient documentation

## 2021-04-19 DIAGNOSIS — N898 Other specified noninflammatory disorders of vagina: Secondary | ICD-10-CM | POA: Insufficient documentation

## 2021-04-19 LAB — URINALYSIS, ROUTINE W REFLEX MICROSCOPIC
Bacteria, UA: NONE SEEN
Bilirubin Urine: NEGATIVE
Glucose, UA: NEGATIVE mg/dL
Hgb urine dipstick: NEGATIVE
Ketones, ur: NEGATIVE mg/dL
Leukocytes,Ua: NEGATIVE
Nitrite: NEGATIVE
Protein, ur: 30 mg/dL — AB
Specific Gravity, Urine: 1.019 (ref 1.005–1.030)
pH: 5 (ref 5.0–8.0)

## 2021-04-19 LAB — BASIC METABOLIC PANEL
Anion gap: 7 (ref 5–15)
BUN: 11 mg/dL (ref 6–20)
CO2: 24 mmol/L (ref 22–32)
Calcium: 9.6 mg/dL (ref 8.9–10.3)
Chloride: 107 mmol/L (ref 98–111)
Creatinine, Ser: 0.99 mg/dL (ref 0.44–1.00)
GFR, Estimated: >60 mL/min (ref >60)
Glucose, Bld: 101 mg/dL — ABNORMAL HIGH (ref 70–99)
Potassium: 4.4 mmol/L (ref 3.5–5.1)
Sodium: 138 mmol/L (ref 135–145)

## 2021-04-19 LAB — CBC
HCT: 36.9 % (ref 36.0–46.0)
Hemoglobin: 12.9 g/dL (ref 12.0–15.0)
MCH: 28.4 pg (ref 26.0–34.0)
MCHC: 35 g/dL (ref 30.0–36.0)
MCV: 81.1 fL (ref 80.0–100.0)
Platelets: 256 10*3/uL (ref 150–400)
RBC: 4.55 MIL/uL (ref 3.87–5.11)
RDW: 13.6 % (ref 11.5–15.5)
WBC: 8.5 10*3/uL (ref 4.0–10.5)
nRBC: 0 % (ref 0.0–0.2)

## 2021-04-19 LAB — I-STAT BETA HCG BLOOD, ED (MC, WL, AP ONLY): I-stat hCG, quantitative: <5 m[IU]/mL (ref <5)

## 2021-04-19 MED ORDER — SODIUM CHLORIDE 0.9% FLUSH: 3.0000 mL | Freq: Once | INTRAVENOUS | Status: DC

## 2021-04-19 NOTE — ED Provider Notes (Signed)
Emergency Medicine Provider Triage Evaluation Note  Eileen [name] , a 45 y.o. female  was evaluated in triage.  Pt complains of syncopal episode at work today.  Her mother is currently hospitalized since she received some "bad news" about her condition and began to experience panic attack which she has history.  States that she began to weep and was hyperventilating and passed out.  She fell to the ground but was assisted to the ground by bystanders at her place of work.  Denies head trauma, blurred or double vision, nausea or vomiting since that time.  Mild headache which she attributes to her crying.  Additionally has vaginal discomfort and "fishy odor".  Notes that her boyfriend was whom she has had unprotected sex was recently treated for an STI though he did not inform her which one.  Review of Systems  Positive: Syncope, headache, vaginal odor and discharge Negative: Chest pain or shortness of breath, palpitations, nausea or vomiting, fevers or chills  Physical Exam  BP (!) 156/114 (BP Location: Left Arm)   Pulse 87   Temp 98.5 F (36.9 C) (Oral)   Resp 15   LMP 03/19/2021   SpO2 100%  Gen:   Awake, no distress   Resp:  Normal effort  MSK:   Moves extremities without difficulty  Other:  Abdomen soft, nondistended, nontender.  RRR no m/r/g  Medical Decision Making  Medically screening exam initiated at 12:34 PM.  Appropriate orders placed.  Eileen [name] was informed that the remainder of the evaluation will be completed by another provider, this initial triage assessment does not replace that evaluation, and the importance of remaining in the ED until their evaluation is complete.  This chart was dictated using voice recognition software, Dragon. Despite the best efforts of this provider to proofread and correct errors, errors may still occur which can change documentation meaning.    Sherrilee Gilles 04/19/21 1236    Ernie Avena, MD 04/19/21 Ernestina [place]

## 2021-04-19 NOTE — ED Triage Notes (Signed)
Pt to triage via [facility] from work.  PT was upset over mother being in hospital and started hyperventilating and had a syncopal episode.  Didn't hit head.  Denies injury.  Also reports vaginal itching x 1 week.  Not taking HTN meds.  Ran out and doesn't have a PCP.

## 2021-04-19 NOTE — ED Notes (Signed)
Patient Stated that she need to leave. I informed her, that we would be glad to see her as soon as possible. She stated that she would come back tomorrow.

## 2021-06-25 ENCOUNTER — Other Ambulatory Visit: Payer: Self-pay | Admitting: Internal Medicine

## 2021-06-25 DIAGNOSIS — Z1231 Encounter for screening mammogram for malignant neoplasm of breast: Secondary | ICD-10-CM

## 2021-08-17 ENCOUNTER — Other Ambulatory Visit: Payer: Self-pay

## 2021-08-17 DIAGNOSIS — Z1231 Encounter for screening mammogram for malignant neoplasm of breast: Secondary | ICD-10-CM

## 2021-09-24 ENCOUNTER — Inpatient Hospital Stay: Admission: RE | Admit: 2021-09-24 | Payer: Self-pay | Source: Ambulatory Visit

## 2021-10-07 ENCOUNTER — Emergency Department (HOSPITAL_COMMUNITY): Admission: EM | Admit: 2021-10-07 | Discharge: 2021-10-07 | Discharge status: Against Medical Advice | Payer: Self-pay

## 2021-10-07 NOTE — ED Notes (Signed)
No answer in lobby.

## 2021-10-07 NOTE — ED Notes (Signed)
Called pt 3x no response °

## 2021-11-04 ENCOUNTER — Other Ambulatory Visit: Payer: Self-pay | Admitting: Internal Medicine

## 2021-11-04 DIAGNOSIS — Z1231 Encounter for screening mammogram for malignant neoplasm of breast: Secondary | ICD-10-CM

## 2021-11-13 ENCOUNTER — Other Ambulatory Visit: Payer: Self-pay

## 2021-11-13 ENCOUNTER — Ambulatory Visit
Admission: RE | Admit: 2021-11-13 | Discharge: 2021-11-13 | Disposition: A | Discharge status: Home / Self Care | Payer: No Typology Code available for payment source | Source: Ambulatory Visit | Attending: Internal Medicine | Admitting: Internal Medicine

## 2021-11-13 DIAGNOSIS — Z1231 Encounter for screening mammogram for malignant neoplasm of breast: Secondary | ICD-10-CM

## 2024-01-17 ENCOUNTER — Emergency Department (HOSPITAL_COMMUNITY)
Admission: EM | Admit: 2024-01-17 | Discharge: 2024-01-17 | Disposition: A | Discharge status: Home / Self Care | Attending: Emergency Medicine | Admitting: Emergency Medicine

## 2024-01-17 ENCOUNTER — Other Ambulatory Visit: Payer: Self-pay

## 2024-01-17 ENCOUNTER — Encounter (HOSPITAL_COMMUNITY): Payer: Self-pay | Admitting: Emergency Medicine

## 2024-01-17 DIAGNOSIS — I10 Essential (primary) hypertension: Secondary | ICD-10-CM | POA: Diagnosis not present

## 2024-01-17 DIAGNOSIS — H6121 Impacted cerumen, right ear: Secondary | ICD-10-CM | POA: Diagnosis not present

## 2024-01-17 DIAGNOSIS — H9201 Otalgia, right ear: Secondary | ICD-10-CM | POA: Diagnosis present

## 2024-01-17 MED ORDER — LISINOPRIL 10 MG PO TABS: 10.0000 mg | ORAL_TABLET | Freq: Every day | ORAL | 2 refills | Status: AC

## 2024-01-17 NOTE — Discharge Instructions (Addendum)
 Return if any problems.

## 2024-01-17 NOTE — ED Triage Notes (Signed)
 Pt endorses right ear pain and fullness for a week. Pt not taking HTN meds for 2-3 years and needing prescription that she can afford.

## 2024-01-17 NOTE — ED Provider Notes (Signed)
  EMERGENCY DEPARTMENT AT [facility] Provider Note   CSN: [id number] Arrival date & time: 01/17/24  4098     History  Chief Complaint  Patient presents with   Otalgia    Eileen [name] is a 48 y.o. female.  Patient complains of pain in her right ear.  Patient reports the ear feels stopped up.  Patient denies any cough or congestion she has not had any fever or chills.  Patient has a history of hypertension.  She states she is out of her blood pressure medicine and does not currently have a primary care physician.  Patient has previously been on lisinopril .  Patient denies any kidney disease she has not had any heart disease.  The history is provided by the patient. No language interpreter was used.  Otalgia Location:  Right      Home Medications Prior to Admission medications   Medication Sig Start Date End Date Taking? Authorizing Provider  lisinopril  (ZESTRIL ) 10 MG tablet Take 1 tablet (10 mg total) by mouth daily. 01/17/24   Dua Mehler K, PA-C  famotidine  (PEPCID ) 20 MG tablet Take 1 tablet (20 mg total) by mouth 2 (two) times daily for 7 days. 10/19/18 07/08/20  Couture, Cortni S, PA-C      Allergies    Penicillins    Review of Systems   Review of Systems  HENT:  Positive for ear pain.   All other systems reviewed and are negative.   Physical Exam Updated Vital Signs BP (!) 172/112   Pulse 88   Temp 98.4 F (36.9 C)   Resp 18   Ht 5\' 5"  (1.651 m)   Wt 81.6 kg   SpO2 99%   BMI 29.94 kg/m  Physical Exam Vitals and nursing note reviewed.  Constitutional:      Appearance: She is well-developed.  HENT:     Head: Normocephalic.     Right Ear: There is impacted cerumen.  Cardiovascular:     Rate and Rhythm: Normal rate.  Pulmonary:     Effort: Pulmonary effort is normal.  Abdominal:     General: There is no distension.  Musculoskeletal:        General: Normal range of motion.     Cervical back: Normal range of motion.  Skin:     General: Skin is warm.  Neurological:     General: No focal deficit present.     Mental Status: She is alert and oriented to person, place, and time.     ED Results / Procedures / Treatments   Labs (all labs ordered are listed, but only abnormal results are displayed) Labs Reviewed - No data to display  EKG None  Radiology No results found.  Procedures Procedures    Medications Ordered in ED Medications - No data to display  ED Course/ Medical Decision Making/ A&P                                 Medical Decision Making Patient complains of pain in her right ear canal.  Risk Prescription drug management. Risk Details: RN irrigated patient's right ear.  Patient reports feeling much better.  Patient is given a prescription for lisinopril .  Patient is advised to follow-up with primary care for recheck of her blood pressure.            Final Clinical Impression(s) / ED Diagnoses Final diagnoses:  Impacted cerumen of  right ear  Hypertension, unspecified type    Rx / DC Orders ED Discharge Orders          Ordered    lisinopril  (ZESTRIL ) 10 MG tablet  Daily        01/17/24 1255           An After Visit Summary was printed and given to the patient.    Sandi Crosby, PA-C 01/17/24 1308    Trish Furl, MD 01/18/24 [phone]

## 2024-06-03 ENCOUNTER — Encounter (HOSPITAL_COMMUNITY): Payer: Self-pay | Admitting: Emergency Medicine

## 2024-06-03 ENCOUNTER — Emergency Department (HOSPITAL_COMMUNITY)
Admission: EM | Admit: 2024-06-03 | Discharge: 2024-06-03 | Disposition: A | Discharge status: Against Medical Advice | Attending: Emergency Medicine | Admitting: Emergency Medicine

## 2024-06-03 DIAGNOSIS — Z5321 Procedure and treatment not carried out due to patient leaving prior to being seen by health care provider: Secondary | ICD-10-CM | POA: Diagnosis not present

## 2024-06-03 DIAGNOSIS — F410 Panic disorder [episodic paroxysmal anxiety] without agoraphobia: Secondary | ICD-10-CM | POA: Diagnosis present

## 2024-06-03 NOTE — ED Triage Notes (Signed)
 Pt here from work with c/o a panic attack , history of same but does not take meds , pt states  that she is feeling better on arrival to  the emergency room

## 2024-06-03 NOTE — ED Notes (Signed)
 Pt was witnessed leaving by staff member. Pt no longer in the ED.
